# Patient Record
Sex: Female | Born: 1940 | Race: White | Hispanic: No | Marital: Married | Smoking: Current every day smoker
Health system: Southern US, Community
[De-identification: ages and names within clinical notes are randomized; demographics above are authoritative.]

## PROBLEM LIST (undated history)

## (undated) ENCOUNTER — Emergency Department (HOSPITAL_COMMUNITY): Discharge status: Absent without leave | Payer: Medicare Other

## (undated) DIAGNOSIS — K219 Gastro-esophageal reflux disease without esophagitis: Secondary | ICD-10-CM

## (undated) DIAGNOSIS — T7840XA Allergy, unspecified, initial encounter: Secondary | ICD-10-CM

## (undated) DIAGNOSIS — Z79890 Hormone replacement therapy (postmenopausal): Secondary | ICD-10-CM

## (undated) DIAGNOSIS — M1990 Unspecified osteoarthritis, unspecified site: Secondary | ICD-10-CM

## (undated) HISTORY — PX: TUBAL LIGATION: SHX77

## (undated) HISTORY — PX: TONSILLECTOMY: SUR1361

## (undated) HISTORY — DX: Hormone replacement therapy: Z79.890

## (undated) HISTORY — DX: Unspecified osteoarthritis, unspecified site: M19.90

## (undated) HISTORY — DX: Allergy, unspecified, initial encounter: T78.40XA

## (undated) HISTORY — PX: APPENDECTOMY: SHX54

## (undated) HISTORY — DX: Gastro-esophageal reflux disease without esophagitis: K21.9

---

## 2001-12-27 ENCOUNTER — Other Ambulatory Visit: Admission: RE | Admit: 2001-12-27 | Discharge: 2001-12-27 | Payer: Self-pay | Admitting: Internal Medicine

## 2002-04-06 ENCOUNTER — Encounter: Admission: RE | Admit: 2002-04-06 | Discharge: 2002-04-06 | Payer: Self-pay | Admitting: Internal Medicine

## 2002-04-06 ENCOUNTER — Encounter: Payer: Self-pay | Admitting: Internal Medicine

## 2003-03-21 ENCOUNTER — Other Ambulatory Visit: Admission: RE | Admit: 2003-03-21 | Discharge: 2003-03-21 | Payer: Self-pay | Admitting: Neurosurgery

## 2003-11-21 ENCOUNTER — Ambulatory Visit (HOSPITAL_COMMUNITY): Admission: RE | Admit: 2003-11-21 | Discharge: 2003-11-21 | Payer: Self-pay | Admitting: Internal Medicine

## 2004-05-27 ENCOUNTER — Ambulatory Visit: Payer: Self-pay | Admitting: Internal Medicine

## 2004-05-27 ENCOUNTER — Other Ambulatory Visit: Admission: RE | Admit: 2004-05-27 | Discharge: 2004-05-27 | Payer: Self-pay | Admitting: Internal Medicine

## 2004-09-23 ENCOUNTER — Ambulatory Visit: Payer: Self-pay | Admitting: Internal Medicine

## 2004-10-09 ENCOUNTER — Ambulatory Visit: Payer: Self-pay | Admitting: Internal Medicine

## 2004-12-05 ENCOUNTER — Ambulatory Visit: Payer: Self-pay | Admitting: Internal Medicine

## 2005-03-05 ENCOUNTER — Other Ambulatory Visit: Admission: RE | Admit: 2005-03-05 | Discharge: 2005-03-05 | Payer: Self-pay | Admitting: Obstetrics and Gynecology

## 2005-11-19 ENCOUNTER — Ambulatory Visit: Payer: Self-pay | Admitting: Internal Medicine

## 2006-02-02 ENCOUNTER — Ambulatory Visit: Payer: Self-pay | Admitting: Internal Medicine

## 2006-04-02 ENCOUNTER — Ambulatory Visit: Payer: Self-pay | Admitting: Internal Medicine

## 2006-05-25 ENCOUNTER — Ambulatory Visit: Payer: Self-pay | Admitting: Internal Medicine

## 2006-08-23 ENCOUNTER — Ambulatory Visit: Payer: Self-pay | Admitting: Internal Medicine

## 2006-08-23 LAB — CONVERTED CEMR LAB
BUN: 12 mg/dL (ref 6–23)
Basophils Relative: 0.6 % (ref 0.0–1.0)
CO2: 28 meq/L (ref 19–32)
Creatinine, Ser: 0.9 mg/dL (ref 0.4–1.2)
Eosinophils Relative: 1.5 % (ref 0.0–5.0)
Glucose, Bld: 88 mg/dL (ref 70–99)
HCT: 43.2 % (ref 36.0–46.0)
Hemoglobin: 14.8 g/dL (ref 12.0–15.0)
Lymphocytes Relative: 30.3 % (ref 12.0–46.0)
Monocytes Absolute: 0.6 10*3/uL (ref 0.2–0.7)
Neutro Abs: 7 10*3/uL (ref 1.4–7.7)
Neutrophils Relative %: 61.9 % (ref 43.0–77.0)
Potassium: 5.9 meq/L — ABNORMAL HIGH (ref 3.5–5.1)
RDW: 13 % (ref 11.5–14.6)
WBC: 11.4 10*3/uL — ABNORMAL HIGH (ref 4.5–10.5)

## 2006-09-22 ENCOUNTER — Ambulatory Visit: Payer: Self-pay | Admitting: Internal Medicine

## 2006-09-22 LAB — CONVERTED CEMR LAB
Basophils Absolute: 0 10*3/uL (ref 0.0–0.1)
Basophils Relative: 0.2 % (ref 0.0–1.0)
Eosinophils Absolute: 0.1 10*3/uL (ref 0.0–0.6)
Eosinophils Relative: 0.7 % (ref 0.0–5.0)
HCT: 43 % (ref 36.0–46.0)
Hemoglobin: 14.9 g/dL (ref 12.0–15.0)
Lymphocytes Relative: 24.2 % (ref 12.0–46.0)
MCHC: 34.6 g/dL (ref 30.0–36.0)
MCV: 89.5 fL (ref 78.0–100.0)
Monocytes Absolute: 0.9 10*3/uL — ABNORMAL HIGH (ref 0.2–0.7)
Monocytes Relative: 6.5 % (ref 3.0–11.0)
Neutro Abs: 9.8 10*3/uL — ABNORMAL HIGH (ref 1.4–7.7)
Neutrophils Relative %: 68.4 % (ref 43.0–77.0)
Platelets: 396 10*3/uL (ref 150–400)
RBC: 4.81 M/uL (ref 3.87–5.11)
RDW: 13.1 % (ref 11.5–14.6)
WBC: 14.3 10*3/uL — ABNORMAL HIGH (ref 4.5–10.5)

## 2006-11-04 ENCOUNTER — Ambulatory Visit: Payer: Self-pay | Admitting: Family Medicine

## 2006-11-04 ENCOUNTER — Encounter: Payer: Self-pay | Admitting: Internal Medicine

## 2006-12-22 ENCOUNTER — Ambulatory Visit: Payer: Self-pay | Admitting: Internal Medicine

## 2007-01-11 ENCOUNTER — Ambulatory Visit: Payer: Self-pay | Admitting: Internal Medicine

## 2007-01-19 ENCOUNTER — Ambulatory Visit: Payer: Self-pay | Admitting: Internal Medicine

## 2007-01-19 LAB — CONVERTED CEMR LAB
Basophils Relative: 0.2 % (ref 0.0–1.0)
HCT: 39.2 % (ref 36.0–46.0)
Hemoglobin: 13.6 g/dL (ref 12.0–15.0)
Monocytes Absolute: 0.7 10*3/uL (ref 0.2–0.7)
Neutrophils Relative %: 72.8 % (ref 43.0–77.0)
RBC: 4.32 M/uL (ref 3.87–5.11)
RDW: 13.5 % (ref 11.5–14.6)

## 2007-01-24 ENCOUNTER — Encounter: Payer: Self-pay | Admitting: Internal Medicine

## 2007-01-24 LAB — COMPREHENSIVE METABOLIC PANEL
ALT: 17 U/L (ref 0–35)
AST: 18 U/L (ref 0–37)
Albumin: 4.4 g/dL (ref 3.5–5.2)
Alkaline Phosphatase: 98 U/L (ref 39–117)
BUN: 11 mg/dL (ref 6–23)
CO2: 23 mEq/L (ref 19–32)
Calcium: 9.5 mg/dL (ref 8.4–10.5)
Chloride: 104 mEq/L (ref 96–112)
Creatinine, Ser: 0.91 mg/dL (ref 0.40–1.20)
Glucose, Bld: 79 mg/dL (ref 70–99)
Potassium: 4.7 mEq/L (ref 3.5–5.3)
Sodium: 139 mEq/L (ref 135–145)
Total Bilirubin: 0.3 mg/dL (ref 0.3–1.2)
Total Protein: 7.2 g/dL (ref 6.0–8.3)

## 2007-01-24 LAB — CBC WITH DIFFERENTIAL/PLATELET
BASO%: 0.2 % (ref 0.0–2.0)
Basophils Absolute: 0 10*3/uL (ref 0.0–0.1)
EOS%: 0.8 % (ref 0.0–7.0)
HCT: 42 % (ref 34.8–46.6)
MCH: 31.5 pg (ref 26.0–34.0)
MCHC: 35 g/dL (ref 32.0–36.0)
MCV: 89.7 fL (ref 81.0–101.0)
MONO%: 4.7 % (ref 0.0–13.0)
NEUT%: 76.3 % (ref 39.6–76.8)
lymph#: 2.7 10*3/uL (ref 0.9–3.3)

## 2007-01-26 ENCOUNTER — Ambulatory Visit: Payer: Self-pay | Admitting: Internal Medicine

## 2007-01-26 LAB — CONVERTED CEMR LAB
Eosinophils Absolute: 0.1 10*3/uL (ref 0.0–0.6)
Eosinophils Relative: 0.8 % (ref 0.0–5.0)
Hemoglobin: 14.4 g/dL (ref 12.0–15.0)
Lymphocytes Relative: 24.4 % (ref 12.0–46.0)
MCV: 90.9 fL (ref 78.0–100.0)
Monocytes Absolute: 0.8 10*3/uL — ABNORMAL HIGH (ref 0.2–0.7)
Monocytes Relative: 6 % (ref 3.0–11.0)
Neutro Abs: 9.5 10*3/uL — ABNORMAL HIGH (ref 1.4–7.7)
Platelets: 369 10*3/uL (ref 150–400)
WBC: 13.9 10*3/uL — ABNORMAL HIGH (ref 4.5–10.5)

## 2007-02-02 LAB — BCR/ABL

## 2007-02-08 ENCOUNTER — Encounter: Payer: Self-pay | Admitting: Internal Medicine

## 2007-02-08 LAB — CBC WITH DIFFERENTIAL/PLATELET
EOS%: 0.8 % (ref 0.0–7.0)
Eosinophils Absolute: 0.1 10*3/uL (ref 0.0–0.5)
HGB: 14.8 g/dL (ref 11.6–15.9)
MCH: 31.4 pg (ref 26.0–34.0)
MCV: 88.7 fL (ref 81.0–101.0)
MONO%: 6.2 % (ref 0.0–13.0)
NEUT#: 9.3 10*3/uL — ABNORMAL HIGH (ref 1.5–6.5)
RBC: 4.72 10*6/uL (ref 3.70–5.32)
RDW: 13.4 % (ref 11.3–14.5)
lymph#: 3.1 10*3/uL (ref 0.9–3.3)

## 2007-02-08 LAB — LACTATE DEHYDROGENASE: LDH: 149 U/L (ref 94–250)

## 2007-03-15 ENCOUNTER — Telehealth: Payer: Self-pay | Admitting: Internal Medicine

## 2007-03-24 DIAGNOSIS — E785 Hyperlipidemia, unspecified: Secondary | ICD-10-CM

## 2007-03-28 ENCOUNTER — Ambulatory Visit: Payer: Self-pay | Admitting: Internal Medicine

## 2007-03-28 DIAGNOSIS — M1990 Unspecified osteoarthritis, unspecified site: Secondary | ICD-10-CM | POA: Insufficient documentation

## 2007-03-28 DIAGNOSIS — J309 Allergic rhinitis, unspecified: Secondary | ICD-10-CM | POA: Insufficient documentation

## 2007-03-28 DIAGNOSIS — K219 Gastro-esophageal reflux disease without esophagitis: Secondary | ICD-10-CM

## 2007-03-28 DIAGNOSIS — D7289 Other specified disorders of white blood cells: Secondary | ICD-10-CM

## 2007-03-28 LAB — CONVERTED CEMR LAB: Cholesterol, target level: 200 mg/dL

## 2007-04-01 ENCOUNTER — Telehealth: Payer: Self-pay | Admitting: *Deleted

## 2007-04-01 LAB — CONVERTED CEMR LAB
Basophils Absolute: 0.1 10*3/uL (ref 0.0–0.1)
Basophils Relative: 0.5 % (ref 0.0–1.0)
Cholesterol: 196 mg/dL (ref 0–200)
HDL: 39.7 mg/dL (ref >39.0)
Hemoglobin: 14.3 g/dL (ref 12.0–15.0)
LDL Cholesterol: 123 mg/dL — ABNORMAL HIGH (ref 0–99)
MCHC: 34.5 g/dL (ref 30.0–36.0)
Monocytes Absolute: 0.7 10*3/uL (ref 0.2–0.7)
Monocytes Relative: 5.4 % (ref 3.0–11.0)
Platelets: 354 10*3/uL (ref 150–400)
RBC: 4.55 M/uL (ref 3.87–5.11)
RDW: 13 % (ref 11.5–14.6)
Total CHOL/HDL Ratio: 4.9
VLDL: 33 mg/dL (ref 0–40)

## 2007-04-13 ENCOUNTER — Telehealth: Payer: Self-pay | Admitting: Internal Medicine

## 2007-05-02 ENCOUNTER — Ambulatory Visit: Payer: Self-pay | Admitting: Internal Medicine

## 2007-05-04 LAB — COMPREHENSIVE METABOLIC PANEL
ALT: 13 U/L (ref 0–35)
Albumin: 4.5 g/dL (ref 3.5–5.2)
CO2: 24 mEq/L (ref 19–32)
Calcium: 9.8 mg/dL (ref 8.4–10.5)
Chloride: 102 mEq/L (ref 96–112)
Potassium: 4.6 mEq/L (ref 3.5–5.3)
Sodium: 139 mEq/L (ref 135–145)
Total Protein: 7.4 g/dL (ref 6.0–8.3)

## 2007-05-04 LAB — CBC WITH DIFFERENTIAL/PLATELET
BASO%: 0.5 % (ref 0.0–2.0)
Eosinophils Absolute: 0.2 10*3/uL (ref 0.0–0.5)
MCHC: 34.6 g/dL (ref 32.0–36.0)
MONO#: 0.7 10*3/uL (ref 0.1–0.9)
NEUT#: 8.9 10*3/uL — ABNORMAL HIGH (ref 1.5–6.5)
RBC: 4.62 10*6/uL (ref 3.70–5.32)
WBC: 13.7 10*3/uL — ABNORMAL HIGH (ref 3.9–10.0)
lymph#: 3.9 10*3/uL — ABNORMAL HIGH (ref 0.9–3.3)

## 2007-05-04 LAB — LACTATE DEHYDROGENASE: LDH: 170 U/L (ref 94–250)

## 2007-05-11 ENCOUNTER — Encounter: Payer: Self-pay | Admitting: Internal Medicine

## 2007-05-11 ENCOUNTER — Telehealth: Payer: Self-pay | Admitting: Internal Medicine

## 2007-05-27 ENCOUNTER — Ambulatory Visit: Payer: Self-pay | Admitting: Internal Medicine

## 2007-05-27 DIAGNOSIS — J019 Acute sinusitis, unspecified: Secondary | ICD-10-CM

## 2007-07-25 ENCOUNTER — Telehealth: Payer: Self-pay | Admitting: Internal Medicine

## 2007-08-04 ENCOUNTER — Telehealth: Payer: Self-pay | Admitting: Internal Medicine

## 2007-08-30 ENCOUNTER — Ambulatory Visit: Payer: Self-pay | Admitting: Internal Medicine

## 2007-08-30 DIAGNOSIS — K648 Other hemorrhoids: Secondary | ICD-10-CM | POA: Insufficient documentation

## 2007-08-30 DIAGNOSIS — F17200 Nicotine dependence, unspecified, uncomplicated: Secondary | ICD-10-CM

## 2007-09-08 LAB — CONVERTED CEMR LAB
Basophils Relative: 0.4 % (ref 0.0–1.0)
Eosinophils Absolute: 0.2 10*3/uL (ref 0.0–0.6)
Eosinophils Relative: 1.8 % (ref 0.0–5.0)
HCT: 41.1 % (ref 36.0–46.0)
Hemoglobin: 14.1 g/dL (ref 12.0–15.0)
Lymphocytes Relative: 27 % (ref 12.0–46.0)
MCV: 91 fL (ref 78.0–100.0)
Neutro Abs: 8.1 10*3/uL — ABNORMAL HIGH (ref 1.4–7.7)
Neutrophils Relative %: 64.9 % (ref 43.0–77.0)
Platelets: 327 10*3/uL (ref 150–400)
WBC: 12.3 10*3/uL — ABNORMAL HIGH (ref 4.5–10.5)

## 2007-12-06 ENCOUNTER — Telehealth: Payer: Self-pay | Admitting: Internal Medicine

## 2007-12-07 ENCOUNTER — Ambulatory Visit: Payer: Self-pay | Admitting: Internal Medicine

## 2007-12-07 DIAGNOSIS — J0100 Acute maxillary sinusitis, unspecified: Secondary | ICD-10-CM | POA: Insufficient documentation

## 2007-12-07 LAB — CONVERTED CEMR LAB
Basophils Absolute: 0 10*3/uL (ref 0.0–0.1)
Basophils Relative: 0.2 % (ref 0.0–1.0)
Eosinophils Absolute: 0.1 10*3/uL (ref 0.0–0.7)
Lymphocytes Relative: 20 % (ref 12.0–46.0)
MCHC: 33.2 g/dL (ref 30.0–36.0)
MCV: 92.6 fL (ref 78.0–100.0)
Neutro Abs: 8.7 10*3/uL — ABNORMAL HIGH (ref 1.4–7.7)
Neutrophils Relative %: 73.2 % (ref 43.0–77.0)
RBC: 4.63 M/uL (ref 3.87–5.11)
RDW: 13.2 % (ref 11.5–14.6)

## 2007-12-20 ENCOUNTER — Ambulatory Visit: Payer: Self-pay | Admitting: Internal Medicine

## 2008-01-06 ENCOUNTER — Ambulatory Visit: Payer: Self-pay | Admitting: Internal Medicine

## 2008-01-06 ENCOUNTER — Telehealth: Payer: Self-pay | Admitting: Internal Medicine

## 2008-01-06 DIAGNOSIS — N390 Urinary tract infection, site not specified: Secondary | ICD-10-CM

## 2008-01-06 DIAGNOSIS — N760 Acute vaginitis: Secondary | ICD-10-CM | POA: Insufficient documentation

## 2008-01-06 DIAGNOSIS — M5380 Other specified dorsopathies, site unspecified: Secondary | ICD-10-CM | POA: Insufficient documentation

## 2008-01-06 LAB — CONVERTED CEMR LAB
Glucose, Urine, Semiquant: NEGATIVE
Ketones, urine, test strip: NEGATIVE
Nitrite: NEGATIVE
Specific Gravity, Urine: <1.005
WBC Urine, dipstick: NEGATIVE
pH: 7

## 2008-03-14 ENCOUNTER — Telehealth: Payer: Self-pay | Admitting: Internal Medicine

## 2008-03-16 ENCOUNTER — Telehealth: Payer: Self-pay | Admitting: Internal Medicine

## 2008-04-16 ENCOUNTER — Ambulatory Visit: Payer: Self-pay | Admitting: Internal Medicine

## 2008-04-16 LAB — CONVERTED CEMR LAB
ALT: 16 units/L (ref 0–35)
AST: 24 units/L (ref 0–37)
Albumin: 3.9 g/dL (ref 3.5–5.2)
Alkaline Phosphatase: 78 units/L (ref 39–117)
BUN: 15 mg/dL (ref 6–23)
Basophils Absolute: 0.1 10*3/uL (ref 0.0–0.1)
Basophils Relative: 0.7 % (ref 0.0–3.0)
Bilirubin, Direct: 0.1 mg/dL (ref 0.0–0.3)
CO2: 30 meq/L (ref 19–32)
Calcium: 9.7 mg/dL (ref 8.4–10.5)
Chloride: 107 meq/L (ref 96–112)
Cholesterol: 230 mg/dL (ref 0–200)
Creatinine, Ser: 0.9 mg/dL (ref 0.4–1.2)
Direct LDL: 141.3 mg/dL
Eosinophils Absolute: 0.2 10*3/uL (ref 0.0–0.7)
Eosinophils Relative: 1.7 % (ref 0.0–5.0)
GFR calc Af Amer: 80 mL/min
GFR calc non Af Amer: 66 mL/min
Glucose, Bld: 90 mg/dL (ref 70–99)
HCT: 42 % (ref 36.0–46.0)
HDL: 44.9 mg/dL (ref >39.0)
Hemoglobin: 14.3 g/dL (ref 12.0–15.0)
Lymphocytes Relative: 26.5 % (ref 12.0–46.0)
MCHC: 34.1 g/dL (ref 30.0–36.0)
MCV: 94.4 fL (ref 78.0–100.0)
Monocytes Absolute: 0.7 10*3/uL (ref 0.1–1.0)
Monocytes Relative: 7.5 % (ref 3.0–12.0)
Neutro Abs: 6 10*3/uL (ref 1.4–7.7)
Neutrophils Relative %: 63.6 % (ref 43.0–77.0)
Platelets: 344 10*3/uL (ref 150–400)
Potassium: 4.4 meq/L (ref 3.5–5.1)
RBC: 4.45 M/uL (ref 3.87–5.11)
RDW: 13.3 % (ref 11.5–14.6)
Sodium: 146 meq/L — ABNORMAL HIGH (ref 135–145)
TSH: 3.81 microintl units/mL (ref 0.35–5.50)
Total Bilirubin: 0.4 mg/dL (ref 0.3–1.2)
Total CHOL/HDL Ratio: 5.1
Total Protein: 7.1 g/dL (ref 6.0–8.3)
Triglycerides: 157 mg/dL — ABNORMAL HIGH (ref 0–149)
VLDL: 31 mg/dL (ref 0–40)
WBC: 9.5 10*3/uL (ref 4.5–10.5)

## 2008-04-23 ENCOUNTER — Ambulatory Visit: Payer: Self-pay | Admitting: Internal Medicine

## 2008-06-13 ENCOUNTER — Telehealth: Payer: Self-pay | Admitting: Internal Medicine

## 2008-07-18 ENCOUNTER — Ambulatory Visit: Payer: Self-pay | Admitting: Internal Medicine

## 2008-07-18 LAB — CONVERTED CEMR LAB
AST: 21 units/L (ref 0–37)
Albumin: 4 g/dL (ref 3.5–5.2)
Alkaline Phosphatase: 75 units/L (ref 39–117)
Total CHOL/HDL Ratio: 4.2
Triglycerides: 83 mg/dL (ref 0–149)

## 2008-07-26 ENCOUNTER — Ambulatory Visit: Payer: Self-pay | Admitting: Internal Medicine

## 2008-09-09 LAB — CONVERTED CEMR LAB: Pap Smear: NORMAL

## 2008-09-26 ENCOUNTER — Ambulatory Visit: Payer: Self-pay | Admitting: Internal Medicine

## 2008-09-26 LAB — CONVERTED CEMR LAB
ALT: 17 units/L (ref 0–35)
AST: 25 units/L (ref 0–37)
Albumin: 4.4 g/dL (ref 3.5–5.2)
Basophils Absolute: 0 10*3/uL (ref 0.0–0.1)
Hemoglobin: 15.1 g/dL — ABNORMAL HIGH (ref 12.0–15.0)
Lymphocytes Relative: 21.2 % (ref 12.0–46.0)
Monocytes Relative: 4.8 % (ref 3.0–12.0)
Platelets: 320 10*3/uL (ref 150.0–400.0)
RDW: 13.3 % (ref 11.5–14.6)
TSH: 1.84 microintl units/mL (ref 0.35–5.50)

## 2008-10-04 ENCOUNTER — Ambulatory Visit: Payer: Self-pay | Admitting: Internal Medicine

## 2009-01-03 ENCOUNTER — Ambulatory Visit: Payer: Self-pay | Admitting: Internal Medicine

## 2009-01-03 LAB — CONVERTED CEMR LAB
Basophils Absolute: 0 10*3/uL
Basophils Relative: 0.2 %
Cholesterol: 210 mg/dL — ABNORMAL HIGH
Direct LDL: 138.7 mg/dL
Eosinophils Absolute: 0.1 10*3/uL
Eosinophils Relative: 1.1 %
HCT: 42.8 %
HDL: 51.8 mg/dL
Hemoglobin: 14.9 g/dL
Lymphocytes Relative: 17.9 %
Lymphs Abs: 1.8 10*3/uL
MCHC: 34.7 g/dL
MCV: 92.3 fL
Monocytes Absolute: 0.6 10*3/uL
Monocytes Relative: 6.2 %
Neutro Abs: 7.8 10*3/uL — ABNORMAL HIGH
Neutrophils Relative %: 74.6 %
Platelets: 349 10*3/uL
RBC: 4.63 M/uL
RDW: 12.9 %
TSH: 1.77 u[IU]/mL
Total CHOL/HDL Ratio: 4
Triglycerides: 116 mg/dL
VLDL: 23.2 mg/dL
WBC: 10.3 10*3/uL

## 2009-02-18 ENCOUNTER — Ambulatory Visit: Payer: Self-pay | Admitting: Internal Medicine

## 2009-02-18 ENCOUNTER — Telehealth: Payer: Self-pay | Admitting: Internal Medicine

## 2009-02-22 ENCOUNTER — Telehealth (INDEPENDENT_AMBULATORY_CARE_PROVIDER_SITE_OTHER): Payer: Self-pay | Admitting: *Deleted

## 2009-02-26 ENCOUNTER — Ambulatory Visit: Payer: Self-pay | Admitting: Internal Medicine

## 2009-04-15 ENCOUNTER — Telehealth: Payer: Self-pay | Admitting: Internal Medicine

## 2009-04-25 ENCOUNTER — Ambulatory Visit: Payer: Self-pay | Admitting: Internal Medicine

## 2009-06-03 ENCOUNTER — Ambulatory Visit: Payer: Self-pay | Admitting: Internal Medicine

## 2009-06-03 LAB — CONVERTED CEMR LAB
ALT: 17 units/L (ref 0–35)
AST: 23 units/L (ref 0–37)
Albumin: 4 g/dL (ref 3.5–5.2)
Alkaline Phosphatase: 73 units/L (ref 39–117)
Basophils Relative: 0.6 % (ref 0.0–3.0)
Eosinophils Relative: 3.1 % (ref 0.0–5.0)
GFR calc non Af Amer: 65.74 mL/min (ref >60)
Glucose, Bld: 98 mg/dL (ref 70–99)
Glucose, Urine, Semiquant: NEGATIVE
HCT: 41 % (ref 36.0–46.0)
Hemoglobin: 13.9 g/dL (ref 12.0–15.0)
Lymphs Abs: 2.2 10*3/uL (ref 0.7–4.0)
Monocytes Relative: 5.2 % (ref 3.0–12.0)
Neutro Abs: 6.3 10*3/uL (ref 1.4–7.7)
Potassium: 4.6 meq/L (ref 3.5–5.1)
Protein, U semiquant: NEGATIVE
RBC: 4.32 M/uL (ref 3.87–5.11)
RDW: 13.8 % (ref 11.5–14.6)
Sodium: 140 meq/L (ref 135–145)
Specific Gravity, Urine: 1.015
TSH: 2.88 microintl units/mL (ref 0.35–5.50)
Total CHOL/HDL Ratio: 4
Total Protein: 6.8 g/dL (ref 6.0–8.3)
VLDL: 20.4 mg/dL (ref 0.0–40.0)
WBC: 9.4 10*3/uL (ref 4.5–10.5)
pH: 7

## 2009-06-10 ENCOUNTER — Ambulatory Visit: Payer: Self-pay | Admitting: Internal Medicine

## 2009-07-29 ENCOUNTER — Telehealth: Payer: Self-pay | Admitting: Internal Medicine

## 2009-09-23 ENCOUNTER — Ambulatory Visit: Payer: Self-pay | Admitting: Internal Medicine

## 2009-09-24 ENCOUNTER — Telehealth: Payer: Self-pay | Admitting: Internal Medicine

## 2009-10-01 ENCOUNTER — Telehealth: Payer: Self-pay | Admitting: Internal Medicine

## 2009-10-15 ENCOUNTER — Telehealth: Payer: Self-pay | Admitting: Internal Medicine

## 2009-12-18 ENCOUNTER — Telehealth: Payer: Self-pay | Admitting: Internal Medicine

## 2009-12-19 ENCOUNTER — Ambulatory Visit: Payer: Self-pay | Admitting: Internal Medicine

## 2009-12-19 DIAGNOSIS — R319 Hematuria, unspecified: Secondary | ICD-10-CM | POA: Insufficient documentation

## 2009-12-19 LAB — CONVERTED CEMR LAB
Bilirubin Urine: NEGATIVE
Glucose, Urine, Semiquant: NEGATIVE
Ketones, urine, test strip: NEGATIVE
Specific Gravity, Urine: 1.01
WBC Urine, dipstick: NEGATIVE
pH: 7

## 2009-12-20 ENCOUNTER — Encounter: Payer: Self-pay | Admitting: Internal Medicine

## 2009-12-20 ENCOUNTER — Ambulatory Visit (HOSPITAL_COMMUNITY): Admission: RE | Admit: 2009-12-20 | Discharge: 2009-12-20 | Payer: Self-pay | Admitting: Internal Medicine

## 2009-12-24 ENCOUNTER — Ambulatory Visit: Payer: Self-pay | Admitting: Internal Medicine

## 2009-12-24 DIAGNOSIS — N281 Cyst of kidney, acquired: Secondary | ICD-10-CM

## 2010-01-02 ENCOUNTER — Telehealth: Payer: Self-pay | Admitting: Internal Medicine

## 2010-01-03 ENCOUNTER — Ambulatory Visit: Payer: Self-pay | Admitting: Family Medicine

## 2010-01-03 ENCOUNTER — Encounter: Payer: Self-pay | Admitting: Internal Medicine

## 2010-01-03 LAB — CONVERTED CEMR LAB
Basophils Absolute: 0 10*3/uL (ref 0.0–0.1)
Basophils Relative: 0 % (ref 0–1)
Bilirubin Urine: NEGATIVE
Glucose, Urine, Semiquant: NEGATIVE
MCHC: 33.6 g/dL (ref 30.0–36.0)
Monocytes Relative: 8 % (ref 3–12)
Neutro Abs: 7.6 10*3/uL (ref 1.7–7.7)
Neutrophils Relative %: 68 % (ref 43–77)
Nitrite: NEGATIVE
RBC: 4.76 M/uL (ref 3.87–5.11)
Sed Rate: 6 mm/hr (ref 0–22)
Specific Gravity, Urine: 1

## 2010-02-21 ENCOUNTER — Telehealth (INDEPENDENT_AMBULATORY_CARE_PROVIDER_SITE_OTHER): Payer: Self-pay | Admitting: *Deleted

## 2010-03-20 ENCOUNTER — Encounter: Payer: Self-pay | Admitting: Internal Medicine

## 2010-03-24 ENCOUNTER — Ambulatory Visit (HOSPITAL_COMMUNITY): Admission: RE | Admit: 2010-03-24 | Discharge: 2010-03-24 | Payer: Self-pay | Admitting: Internal Medicine

## 2010-04-14 ENCOUNTER — Ambulatory Visit: Payer: Self-pay | Admitting: Internal Medicine

## 2010-04-14 ENCOUNTER — Telehealth: Payer: Self-pay | Admitting: Internal Medicine

## 2010-04-14 DIAGNOSIS — L723 Sebaceous cyst: Secondary | ICD-10-CM

## 2010-04-15 ENCOUNTER — Encounter: Payer: Self-pay | Admitting: Internal Medicine

## 2010-04-21 ENCOUNTER — Telehealth: Payer: Self-pay | Admitting: Internal Medicine

## 2010-04-25 ENCOUNTER — Telehealth: Payer: Self-pay | Admitting: Internal Medicine

## 2010-06-03 ENCOUNTER — Ambulatory Visit: Payer: Self-pay | Admitting: Internal Medicine

## 2010-06-03 DIAGNOSIS — L218 Other seborrheic dermatitis: Secondary | ICD-10-CM

## 2010-07-15 ENCOUNTER — Ambulatory Visit
Admission: RE | Admit: 2010-07-15 | Discharge: 2010-07-15 | Payer: Self-pay | Source: Home / Self Care | Attending: Internal Medicine | Admitting: Internal Medicine

## 2010-07-15 LAB — CONVERTED CEMR LAB
Albumin: 3.9 g/dL (ref 3.5–5.2)
Alkaline Phosphatase: 74 units/L (ref 39–117)
Basophils Relative: 0.2 % (ref 0.0–3.0)
CO2: 28 meq/L (ref 19–32)
Chloride: 104 meq/L (ref 96–112)
Eosinophils Absolute: 0.2 10*3/uL (ref 0.0–0.7)
HCT: 43.5 % (ref 36.0–46.0)
Hemoglobin: 14.7 g/dL (ref 12.0–15.0)
Ketones, urine, test strip: NEGATIVE
Lymphocytes Relative: 16.4 % (ref 12.0–46.0)
MCHC: 33.8 g/dL (ref 30.0–36.0)
MCV: 94.8 fL (ref 78.0–100.0)
Monocytes Absolute: 0.7 10*3/uL (ref 0.1–1.0)
Neutro Abs: 10.1 10*3/uL — ABNORMAL HIGH (ref 1.4–7.7)
Nitrite: NEGATIVE
Potassium: 4.7 meq/L (ref 3.5–5.1)
RBC: 4.59 M/uL (ref 3.87–5.11)
Sodium: 139 meq/L (ref 135–145)
Total CHOL/HDL Ratio: 4
Total Protein: 6.9 g/dL (ref 6.0–8.3)
pH: 7

## 2010-07-21 ENCOUNTER — Ambulatory Visit
Admission: RE | Admit: 2010-07-21 | Discharge: 2010-07-21 | Payer: Self-pay | Source: Home / Self Care | Attending: Internal Medicine | Admitting: Internal Medicine

## 2010-07-21 DIAGNOSIS — N6009 Solitary cyst of unspecified breast: Secondary | ICD-10-CM | POA: Insufficient documentation

## 2010-07-22 ENCOUNTER — Encounter
Admission: RE | Admit: 2010-07-22 | Discharge: 2010-07-22 | Payer: Self-pay | Source: Home / Self Care | Attending: Obstetrics and Gynecology | Admitting: Obstetrics and Gynecology

## 2010-08-12 NOTE — Progress Notes (Signed)
Summary: Pt had questions re: xray and req refill of Meloxicam  Phone Note Call from Patient Call back at Home Phone [phone]   Caller: Patient Summary of Call: Pt called and has questions re: xray results. Pt also req refill of Meloxicam 15mg . Pls call in to [facility].  Initial call taken by: Lucy Antigua,  April 25, 2010 9:41 AM    Prescriptions: MELOXICAM 15 MG TABS (MELOXICAM) 1 once daily as needed  #30 x 2   Entered by:   Willy Eddy, LPN   Authorized by:   Stacie Glaze MD   Signed by:   Willy Eddy, LPN on 09/81/1914   Method used:   Electronically to        [facility]       [address]       [place], [place]  [address]       Ph: [phone] or [phone]       Fax: [phone]   RxID:   [id number]

## 2010-08-12 NOTE — Progress Notes (Signed)
Summary: Switch to Malaxacam  Phone Note Call from Patient Call back at Home Phone [phone]   Summary of Call: Currently taking Durabac, she feels this med does not work and wants to go back on Malaxacam.  [facility] send [facility]ill to [facility]  Initial call taken by: Trixie Dredge,  April 21, 2010 9:34 AM    New/Updated Medications: MELOXICAM 15 MG TABS (MELOXICAM) 1 once daily as needed Prescriptions: MELOXICAM 15 MG TABS (MELOXICAM) 1 once daily as needed  #30 x 1   Entered by:   Willy Eddy, LPN   Authorized by:   Stacie Glaze MD   Signed by:   Willy Eddy, LPN on 13/02/6577   Method used:   Electronically to        [facility]       [address]       [place], [place]  [address]       Ph: [phone] or [phone]       Fax: [phone]   RxID:   [id number]

## 2010-08-12 NOTE — Progress Notes (Signed)
  Phone Note Call from Patient   Caller: Patient Call For: Stacie Glaze MD Summary of Call: [phone] Pt feels like "she is hungry" all the time.  Also complains mid back pain.  Pain is better after eating.  Wants to increase Ompeprazole to two times a day.   [facility] Initial call taken by: Lynann Beaver CMA,  February 21, 2010 12:50 PM  Follow-up for Phone Call        per dr Lovell Sheehan- m ay take omeprazole two times a day  Follow-up by: Willy Eddy, LPN,  February 21, 2010 2:09 PM  Additional Follow-up for Phone Call Additional follow up Details #1::        Notified pt. Additional Follow-up by: Lynann Beaver CMA,  February 21, 2010 2:19 PM

## 2010-08-12 NOTE — Progress Notes (Signed)
Summary: sore in nose  Phone Note Call from Patient Call back at Home Phone [phone]   Reason for Call: Insurance Question Summary of Call: Has a sore in her nose right side.  Has been putting Neosporin on it & it's not working.  Has had impetigo before nose same are on right side.  Gave sample of something before in a little packet & it went away.  It was there ot ov, but forgot to mention.  [facility]  [facility]. Initial call taken by: Rudy Jew, RN,  October 15, 2009 11:37 AM  Follow-up for Phone Call        per dr Lovell Sheehan- may have generic bactroban ointment- apply three times a day-already sent to pharmacy Follow-up by: Willy Eddy, LPN,  October 15, 2009 11:42 AM  Additional Follow-up for Phone Call Additional follow up Details #1::        Phone Call Completed Additional Follow-up by: Rudy Jew, RN,  October 15, 2009 11:44 AM    New/Updated Medications: BACTROBAN 2 % OINT (MUPIROCIN) apply to sore tid Prescriptions: BACTROBAN 2 % OINT (MUPIROCIN) apply to sore tid  #1 small tube x 0   Entered by:   Willy Eddy, LPN   Authorized by:   Stacie Glaze MD   Signed by:   Willy Eddy, LPN on 40/04/2724   Method used:   Electronically to        [facility]       [address]       [place], [place]  [address]       Ph: [phone] or [phone]       Fax: [phone]   RxID:   [id number]

## 2010-08-12 NOTE — Progress Notes (Signed)
Summary: ear pain/ pain behind eyes  Phone Note From Pharmacy Call back at Home Phone [phone]   Caller: Patient Call For: Stacie Glaze MD Summary of Call: pain behind eyes for 1 month also ear pain,  pt would like something [facility] [phone] Initial call taken by: Heron Sabins,  July 29, 2009 9:23 AM Reason for Call: Allergy Alert, Medication not on formulary  Follow-up for Phone Call        per edr jenins-septra ds two times a day for 10 days and take zyrtec d- med sent in and pt informed Follow-up by: Willy Eddy, LPN,  July 29, 2009 9:35 AM    New/Updated Medications: SEPTRA DS 800-160 MG TABS (SULFAMETHOXAZOLE-TRIMETHOPRIM) 1 two times a day Prescriptions: SEPTRA DS 800-160 MG TABS (SULFAMETHOXAZOLE-TRIMETHOPRIM) 1 two times a day  #20 x 0   Entered by:   Willy Eddy, LPN   Authorized by:   Stacie Glaze MD   Signed by:   Willy Eddy, LPN on 58/85/0277   Method used:   Electronically to        [facility]       [address]       [place], [place]  [address]       Ph: [phone] or [phone]       Fax: [phone]   RxID:   [id number]

## 2010-08-12 NOTE — Assessment & Plan Note (Signed)
Summary: uti/bmw   Vital Signs:  Patient profile:   70 year old female Height:      61 inches Weight:      152 pounds BMI:     28.82 Temp:     99.1 degrees F oral Pulse rate:   76 / minute Resp:     14 per minute BP sitting:   130 / 80  (left arm)  Vitals Entered By: Willy Eddy, LPN (December 19, 1608 9:00 AM) CC: c/o low back pain- no dysuria   CC:  c/o low back pain- no dysuria.  History of Present Illness: slight fever with back and flank pain with blood in urine the pain is constant and low grade lke a tooth ache and has some nausea with this no pleuritic pain or cough heating pad did not help no hx of renal dz  Preventive Screening-Counseling & Management  Alcohol-Tobacco     Smoking Status: current     Smoking Cessation Counseling: yes     Packs/Day: 1  Problems Prior to Update: 1)  Chest Discomfort, Atypical  (ICD-786.59) 2)  Bronchitis, Acute  (ICD-466.0) 3)  Physical Examination  (ICD-V70.0) 4)  Muscle Spasm, Back  (ICD-724.8) 5)  Vaginitis, Bacterial  (ICD-616.10) 6)  Uti  (ICD-599.0) 7)  Acute Maxillary Sinusitis  (ICD-461.0) 8)  Internal Hemorrhoids Without Mention Comp  (ICD-455.0) 9)  Tobacco Use  (ICD-305.1) 10)  Sinusitis, Acute  (ICD-461.9) 11)  Osteoarthritis  (ICD-715.90) 12)  Gerd  (ICD-530.81) 13)  Allergic Rhinitis  (ICD-477.9) 14)  Leukocytosis, Chronic  (ICD-288.8) 15)  Hyperlipidemia  (ICD-272.4)  Medications Prior to Update: 1)  Ativan 1 Mg  Tabs (Lorazepam) .... One By Mouth Tid 2)  Prempro 0.3-1.5 Mg  Tabs (Conj Estrog-Medroxyprogest Ace) .... Once Daily 3)  Multivital   Tabs (Multiple Vitamins-Minerals) .... Once Daily 4)  Unisom 25 Mg  Tabs (Doxylamine Succinate (Sleep)) .... At Bedtime 5)  Proctosol Hc 2.5 %  Crea (Hydrocortisone) .... Apply To Rectum As Needed  Hemorrhoids 6)  Zyrtec Allergy 10 Mg Tabs (Cetirizine Hcl) .Marland Kitchen.. 1 Once Daily As Needed 7)  Mucinex Dm Maximum Strength 60-1200 Mg Xr12h-Tab  (Dextromethorphan-Guaifenesin) .Marland Kitchen.. 1 Once Daily As Needed 8)  Omeprazole 40 Mg Cpdr (Omeprazole) .... One By Mouth Daily 9)  Fluticasone Propionate 50 Mcg/act Susp (Fluticasone Propionate) .... Two Spray in Nostril Daily 10)  Etodolac 300 Mg Caps (Etodolac) .... One By Mouth Two Times A Day  Replaces The Meloxicam For The Arthritis 11)  Carisoprodol 350 Mg Tabs (Carisoprodol) .... One By Mouth Nq Hs 12)  Voltaren 1 % Gel (Diclofenac Sodium) .... Aplly To Joints  Two Times A Day Prn 13)  Amoxicillin 500 Mg Caps (Amoxicillin) .Marland Kitchen.. 1 Three Times A Day' For 10 Days 14)  Bactroban 2 % Oint (Mupirocin) .... Apply To Sore Tid  Current Medications (verified): 1)  Ativan 1 Mg  Tabs (Lorazepam) .... One By Mouth Tid 2)  Prempro 0.3-1.5 Mg  Tabs (Conj Estrog-Medroxyprogest Ace) .... Once Daily 3)  Multivital   Tabs (Multiple Vitamins-Minerals) .... Once Daily 4)  Unisom 25 Mg  Tabs (Doxylamine Succinate (Sleep)) .... At Bedtime 5)  Proctosol Hc 2.5 %  Crea (Hydrocortisone) .... Apply To Rectum As Needed  Hemorrhoids 6)  Zyrtec Allergy 10 Mg Tabs (Cetirizine Hcl) .Marland Kitchen.. 1 Once Daily As Needed 7)  Mucinex Dm Maximum Strength 60-1200 Mg Xr12h-Tab (Dextromethorphan-Guaifenesin) .Marland Kitchen.. 1 Once Daily As Needed 8)  Omeprazole 40 Mg Cpdr (Omeprazole) .... One By Mouth  Daily 9)  Fluticasone Propionate 50 Mcg/act Susp (Fluticasone Propionate) .... Two Spray in Nostril Daily 10)  Bactroban 2 % Oint (Mupirocin) .... Apply To Sore Tid 11)  Macrobid 100 Mg Caps (Nitrofurantoin Monohyd Macro) .... One By Mouth Two Times A Day For 7 Days  ( Generic Ok)  Allergies (verified): 1)  * Fluoroquinilone Class  Past History:  Family History: Last updated: 03/24/2007 Family History of HIV Family History Kidney disease Family History of Stroke M 1st degree relative <50  Social History: Last updated: 03/24/2007 Married Retired Current Smoker Alcohol use-no Drug use-no  Risk Factors: Smoking Status: current  (12/19/2009) Packs/Day: 1 (12/19/2009)  Past medical, surgical, family and social histories (including risk factors) reviewed, and no changes noted (except as noted below).  Past Medical History: Reviewed history from 08/30/2007 and no changes required. HRT Allergies Hyperlipidemia Allergic rhinitis GERD Osteoarthritis hemorrhoids  Past Surgical History: Reviewed history from 03/24/2007 and no changes required. Caesarean section x3 Tubal ligation Tonsillectomy  Family History: Reviewed history from 03/24/2007 and no changes required. Family History of HIV Family History Kidney disease Family History of Stroke M 1st degree relative <50  Social History: Reviewed history from 03/24/2007 and no changes required. Married Retired Current Smoker Alcohol use-no Drug use-no  Review of Systems  The patient denies anorexia, fever, weight loss, weight gain, vision loss, decreased hearing, hoarseness, chest pain, syncope, dyspnea on exertion, peripheral edema, prolonged cough, headaches, hemoptysis, abdominal pain, melena, hematochezia, severe indigestion/heartburn, hematuria, incontinence, genital sores, muscle weakness, suspicious skin lesions, transient blindness, difficulty walking, depression, unusual weight change, abnormal bleeding, enlarged lymph nodes, angioedema, and breast masses.         flank pain  Physical Exam  General:  alert and pale.   Head:  normocephalic and atraumatic.   Ears:  R ear normal and L ear normal.   Nose:  no external deformity and no nasal discharge.   Neck:  supple.  no masses.   Lungs:  normal respiratory effort.  increased brnchial breath sounds Heart:  normal rate and regular rhythm.   no rub heard Abdomen:  soft and non-tender.  no masses, no guarding, no rigidity, and no rebound tenderness.     Impression & Recommendations:  Problem # 1:  HEMATURIA UNSPECIFIED (ICD-599.70) Assessment New  The following medications were removed from  the medication list:    Amoxicillin 500 Mg Caps (Amoxicillin) .Marland Kitchen... 1 three times a day' for 10 days Her updated medication list for this problem includes:    Macrobid 100 Mg Caps (Nitrofurantoin monohyd macro) ..... One by mouth two times a day for 7 days  ( generic ok)  Orders: Radiology Referral (Radiology)  Problem # 2:  MUSCLE SPASM, BACK (ICD-724.8) Assessment: Unchanged coulf be spasm or related to  renal dx, [place] ordered The following medications were removed from the medication list:    Etodolac 300 Mg Caps (Etodolac) ..... One by mouth two times a day  replaces the meloxicam for the arthritis    Carisoprodol 350 Mg Tabs (Carisoprodol) ..... One by mouth nq hs  Orders: T-Culture, Urine ([id number])  Problem # 3:  UTI (ICD-599.0) Assessment: Deteriorated reciurrent UTI  culture and [place] ordered The following medications were removed from the medication list:    Amoxicillin 500 Mg Caps (Amoxicillin) .Marland Kitchen... 1 three times a day' for 10 days Her updated medication list for this problem includes:    Macrobid 100 Mg Caps (Nitrofurantoin monohyd macro) ..... One by mouth two times a day for  7 days  ( generic ok)  Orders: UA Dipstick w/o Micro (automated)  (81003) T-Culture, Urine ([id number]) Radiology Referral (Radiology)  Complete Medication List: 1)  Ativan 1 Mg Tabs (Lorazepam) .... One by mouth tid 2)  Prempro 0.3-1.5 Mg Tabs (Conj estrog-medroxyprogest ace) .... Once daily 3)  Multivital Tabs (Multiple vitamins-minerals) .... Once daily 4)  Unisom 25 Mg Tabs (Doxylamine succinate (sleep)) .... At bedtime 5)  Proctosol Hc 2.5 % Crea (Hydrocortisone) .... Apply to rectum as needed  hemorrhoids 6)  Zyrtec Allergy 10 Mg Tabs (Cetirizine hcl) .Marland Kitchen.. 1 once daily as needed 7)  Mucinex Dm Maximum Strength 60-1200 Mg Xr12h-tab (Dextromethorphan-guaifenesin) .Marland Kitchen.. 1 once daily as needed 8)  Omeprazole 40 Mg Cpdr (Omeprazole) .... One by mouth daily 9)  Fluticasone Propionate 50  Mcg/act Susp (Fluticasone propionate) .... Two spray in nostril daily 10)  Bactroban 2 % Oint (Mupirocin) .... Apply to sore tid 11)  Macrobid 100 Mg Caps (Nitrofurantoin monohyd macro) .... One by mouth two times a day for 7 days  ( generic ok) Prescriptions: MACROBID 100 MG CAPS (NITROFURANTOIN MONOHYD MACRO) one by mouth two times a day for 7 days  ( generic ok)  #14 x 0   Entered and Authorized by:   Stacie Glaze MD   Signed by:   Stacie Glaze MD on 12/19/2009   Method used:   Electronically to        [facility]       [address]       [place], [place]  [address]       Ph: [phone] or [phone]       Fax: [phone]   RxID:   [id number]   Laboratory Results   Urine Tests  Date/Time Recieved: December 19, 2009 8:59 AM  Date/Time Reported: December 19, 2009 8:59 AM   Routine Urinalysis   Color: yellow Appearance: Clear Glucose: negative   (Normal Range: Negative) Bilirubin: negative   (Normal Range: Negative) Ketone: negative   (Normal Range: Negative) Spec. Gravity: 1.010   (Normal Range: 1.003-1.035) Blood: trace-lysed   (Normal Range: Negative) pH: 7.0   (Normal Range: 5.0-8.0) Protein: negative   (Normal Range: Negative) Urobilinogen: 0.2   (Normal Range: 0-1) Nitrite: negative   (Normal Range: Negative) Leukocyte Esterace: negative   (Normal Range: Negative)    Comments: Wynona Canes, CMA  December 19, 2009 8:59 AM

## 2010-08-12 NOTE — Progress Notes (Signed)
Summary: headache and pain behind eyes  Phone Note Call from Patient Call back at Work Phone    Caller: Patient Call For: Stacie Glaze MD Reason for Call: Acute Illness Summary of Call: Pt has pain behind eyes and headache, and feels like she has a sinus infection??  Taking Mucinex without any relief.  Minimal congestion. [phone] Initial call taken by: Lynann Beaver CMA,  October 01, 2009 10:59 AM  Follow-up for Phone Call        per dr Lovell Sheehan- amozicillin 500 three times a day for 10 days and can get otc zyrtec d 1 two times a day  Follow-up by: Willy Eddy, LPN,  October 01, 2009 11:44 AM    New/Updated Medications: AMOXICILLIN 500 MG CAPS (AMOXICILLIN) 1 three times a day' for 10 days Prescriptions: AMOXICILLIN 500 MG CAPS (AMOXICILLIN) 1 three times a day' for 10 days  #30 x 0   Entered by:   Willy Eddy, LPN   Authorized by:   Stacie Glaze MD   Signed by:   Willy Eddy, LPN on 45/40/9811   Method used:   Electronically to        [facility]       [address]       [place], [place]  [address]       Ph: [phone] or [phone]       Fax: [phone]   RxID:   [id number]

## 2010-08-12 NOTE — Progress Notes (Signed)
Summary: REQ FOR RX  Phone Note Call from Patient Message from:  Patient on April 14, 2010 2:41 PM  Caller: Patient Summary of Call: Pt called to req that a Rx for yeast infection be [facility]... Pt adv she spoke with Dr Lovell Sheehan about this during her OV today.  Initial call taken by: Debbra Riding,  April 14, 2010 2:42 PM    New/Updated Medications: FLUCONAZOLE 150 MG TABS (FLUCONAZOLE) one by mouth now Prescriptions: FLUCONAZOLE 150 MG TABS (FLUCONAZOLE) one by mouth now  #1 x 0   Entered by:   Lynann Beaver CMA   Authorized by:   Stacie Glaze MD   Signed by:   Lynann Beaver CMA on 04/14/2010   Method used:   Electronically to        [facility]       [address]       [place], [place]  [address]       Ph: [phone] or [phone]       Fax: [phone]   RxID:   [id number]

## 2010-08-12 NOTE — Progress Notes (Signed)
Summary: PT REQ ABX? UTI?  Phone Note Call from Patient   Caller: Patient  [phone] Reason for Call: Talk to Nurse, Talk to Doctor Summary of Call: Pt called to adv that she has been exp back pain (lumbar region) mid-back - denies any other sxs.... Pt believes that she may have a UTI.... Pt requests that an [facility]x [facility] in to [facility].... Pt  didn't want to come in for OV because she said that she already has an appt scheduled with Dr Lovell Sheehan for Tuesday, December 24, 2009..... Can you advise?  Initial call taken by: Debbra Riding,  December 18, 2009 3:00 PM  Follow-up for Phone Call        will come in am for ov/bmw Follow-up by: Willy Eddy, LPN,  December 19, 2954 3:05 PM

## 2010-08-12 NOTE — Assessment & Plan Note (Signed)
Summary: 3 MONTH ROV/NJR   Vital Signs:  Patient profile:   71 year old female Height:      61 inches Weight:      154 pounds BMI:     29.20 Temp:     98.2 degrees F oral Pulse rate:   72 / minute Resp:     14 per minute BP sitting:   120 / 80  (left arm)  Vitals Entered By: Willy Eddy, LPN (December 24, 2009 10:23 AM) CC: roa-f/u ultrasound   CC:  roa-f/u ultrasound.  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) 2)  Hematuria Unspecified  (ICD-599.70) 3)  Chest Discomfort, Atypical  (ICD-786.59) 4)  Bronchitis, Acute  (ICD-466.0) 5)  Physical Examination  (ICD-V70.0) 6)  Muscle Spasm, Back  (ICD-724.8) 7)  Vaginitis, Bacterial  (ICD-616.10) 8)  Uti  (ICD-599.0) 9)  Acute Maxillary Sinusitis  (ICD-461.0) 10)  Internal Hemorrhoids Without Mention Comp  (ICD-455.0) 11)  Tobacco Use  (ICD-305.1) 12)  Sinusitis, Acute  (ICD-461.9) 13)  Osteoarthritis  (ICD-715.90) 14)  Gerd  (ICD-530.81) 15)  Allergic Rhinitis  (ICD-477.9) 16)  Leukocytosis, Chronic  (ICD-288.8) 17)  Hyperlipidemia  (ICD-272.4)  Current Problems (verified): 1)  Hematuria Unspecified  (ICD-599.70) 2)  Chest Discomfort, Atypical  (ICD-786.59) 3)  Bronchitis, Acute  (ICD-466.0) 4)  Physical Examination  (ICD-V70.0) 5)  Muscle Spasm, Back  (ICD-724.8) 6)  Vaginitis, Bacterial  (ICD-616.10) 7)  Uti  (ICD-599.0) 8)  Acute Maxillary Sinusitis  (ICD-461.0) 9)  Internal Hemorrhoids Without Mention Comp  (ICD-455.0) 10)  Tobacco Use  (ICD-305.1) 11)  Sinusitis, Acute  (ICD-461.9) 12)   Osteoarthritis  (ICD-715.90) 13)  Gerd  (ICD-530.81) 14)  Allergic Rhinitis  (ICD-477.9) 15)  Leukocytosis, Chronic  (ICD-288.8) 16)  Hyperlipidemia  (ICD-272.4)  Medications Prior to Update: 1)  Ativan 1 Mg  Tabs (Lorazepam) .... One By Mouth Tid 2)  Prempro 0.3-1.5 Mg  Tabs (Conj Estrog-Medroxyprogest Ace) .... Once Daily 3)  Multivital   Tabs (Multiple Vitamins-Minerals) .... Once Daily 4)  Unisom 25 Mg  Tabs (Doxylamine Succinate (Sleep)) .... At Bedtime 5)  Proctosol Hc 2.5 %  Crea (Hydrocortisone) .... Apply To Rectum As Needed  Hemorrhoids 6)  Zyrtec Allergy 10 Mg Tabs (Cetirizine Hcl) .Marland Kitchen.. 1 Once Daily As Needed 7)  Mucinex Dm Maximum Strength 60-1200 Mg Xr12h-Tab (Dextromethorphan-Guaifenesin) .Marland Kitchen.. 1 Once Daily As Needed 8)  Omeprazole 40 Mg Cpdr (Omeprazole) .... One By Mouth Daily 9)  Fluticasone Propionate 50 Mcg/act Susp (Fluticasone Propionate) .... Two Spray in Nostril Daily 10)  Bactroban 2 % Oint (Mupirocin) .... Apply To Sore Tid 11)  Macrobid 100 Mg Caps (Nitrofurantoin Monohyd Macro) .... One By Mouth Two Times A Day For 7 Days  ( Generic Ok)  Current Medications (verified): 1)  Ativan 1 Mg  Tabs (Lorazepam) .... One By Mouth Tid 2)  Prempro 0.3-1.5 Mg  Tabs (Conj Estrog-Medroxyprogest Ace) .... Once Daily 3)  Multivital   Tabs (Multiple Vitamins-Minerals) .... Once Daily 4)  Unisom 25  Mg  Tabs (Doxylamine Succinate (Sleep)) .... At Bedtime 5)  Proctosol Hc 2.5 %  Crea (Hydrocortisone) .... Apply To Rectum As Needed  Hemorrhoids 6)  Zyrtec Allergy 10 Mg Tabs (Cetirizine Hcl) .Marland Kitchen.. 1 Once Daily As Needed 7)  Mucinex Dm Maximum Strength 60-1200 Mg Xr12h-Tab (Dextromethorphan-Guaifenesin) .Marland Kitchen.. 1 Once Daily As Needed 8)  Omeprazole 40 Mg Cpdr (Omeprazole) .... One By Mouth Daily 9)  Fluticasone Propionate 50 Mcg/act Susp (Fluticasone Propionate) .... Two Spray in Nostril Daily 10)  Bactroban 2 % Oint (Mupirocin) .... Apply To Sore Tid 11)  Macrobid 100 Mg Caps  (Nitrofurantoin Monohyd Macro) .... One By Mouth Two Times A Day For 7 Days  ( Generic Ok) 12)  Meloxicam 15 Mg Tabs (Meloxicam) .Marland Kitchen.. 1 Once Daily  Allergies (verified): 1)  * Fluoroquinilone Class  Past History:  Family History: Last updated: 03/24/2007 Family History of HIV Family History Kidney disease Family History of Stroke M 1st degree relative <50  Social History: Last updated: 03/24/2007 Married Retired Current Smoker Alcohol use-no Drug use-no  Risk Factors: Smoking Status: current (12/24/2009) Packs/Day: 1 (12/24/2009)  Past medical, surgical, family and social histories (including risk factors) reviewed, and no changes noted (except as noted below).  Past Medical History: Reviewed history from 08/30/2007 and no changes required. HRT Allergies Hyperlipidemia Allergic rhinitis GERD Osteoarthritis hemorrhoids  Past Surgical History: Reviewed history from 03/24/2007 and no changes required. Caesarean section x3 Tubal ligation Tonsillectomy  Family History: Reviewed history from 03/24/2007 and no changes required. Family History of HIV Family History Kidney disease Family History of Stroke M 1st degree relative <50  Social History: Reviewed history from 03/24/2007 and no changes required. Married Retired Current Smoker Alcohol use-no Drug use-no  Review of Systems  The patient denies anorexia, fever, weight loss, weight gain, vision loss, decreased hearing, hoarseness, chest pain, syncope, dyspnea on exertion, peripheral edema, prolonged cough, headaches, hemoptysis, abdominal pain, melena, hematochezia, severe indigestion/heartburn, hematuria, incontinence, genital sores, muscle weakness, suspicious skin lesions, transient blindness, difficulty walking, depression, unusual weight change, abnormal bleeding, enlarged lymph nodes, angioedema, and breast masses.    Physical Exam  General:  alert and pale.   Head:  normocephalic and atraumatic.     Eyes:  pupils equal and pupils round.   Ears:  R ear normal and L ear normal.   Nose:  no external deformity and no nasal discharge.   Mouth:  good dentition and pharynx pink and moist.   Breasts:  No mass, nodules, thickening, tenderness, bulging, retraction, inflamation, nipple discharge or skin changes noted.   Lungs:  normal respiratory effort.  increased brnchial breath sounds Heart:  normal rate and regular rhythm.   no rub heard Abdomen:  soft and non-tender.  no masses, no guarding, no rigidity, and no rebound tenderness.   Msk:  lumbar lordosis and SI joint tenderness.   Extremities:  trace left pedal edema and trace right pedal edema.   Neurologic:  alert & oriented X3 and gait normal.     Impression & Recommendations:  Problem # 1:  HEMATURIA UNSPECIFIED (ICD-599.70)  the pt had a cyst on the kidney  there was not other dz seen, the bladder was normal the [place] did not fully characteris the cyst as a simple cyst the the radilogist suggested an MRI in light of the hematuris Her updated medication list for this problem includes:    Metronidazole 500 Mg Tabs (Metronidazole) .Marland Kitchen... 1 tab by mouth three times a day x 7days  Cipro 250 Mg Tabs (Ciprofloxacin hcl) .Marland Kitchen... 1 tab by mouth two times a day x 3 days  Discussed medication use and hematuria work-up.   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 1 tab by mouth three times a day x 7days    Cipro 250 Mg Tabs (Ciprofloxacin hcl) .Marland Kitchen... 1 tab by mouth two times a day x 3 days  Encouraged to push clear liquids, get enough rest, and take acetaminophen as needed. To be seen in 10 days if no  improvement, sooner if worse.  Complete Medication List: 1)  Ativan 1 Mg Tabs (Lorazepam) .... One by mouth tid 2)  Prempro 0.3-1.5 Mg Tabs (Conj estrog-medroxyprogest ace) .... Once daily 3)  Multivital Tabs (Multiple vitamins-minerals) .... Once daily 4)  Unisom 25 Mg Tabs (Doxylamine succinate (sleep)) .... At bedtime 5)  Proctosol Hc 2.5 % Crea (Hydrocortisone) .... Apply to rectum as needed  hemorrhoids 6)  Zyrtec Allergy 10 Mg Tabs (Cetirizine hcl) .Marland Kitchen.. 1 once daily as needed 7)  Mucinex Dm Maximum Strength 60-1200 Mg Xr12h-tab (Dextromethorphan-guaifenesin) .Marland Kitchen.. 1 once daily as needed 8)  Omeprazole 40 Mg Cpdr (Omeprazole) .... One by mouth daily 9)  Fluticasone Propionate 50 Mcg/act Susp (Fluticasone propionate) .... Two spray in nostril daily 10)  Bactroban 2 % Oint (Mupirocin) .... Apply to sore tid 11)  Meloxicam 15 Mg Tabs (Meloxicam) .Marland Kitchen.. 1 once daily 12)  Tramadol Hcl 50 Mg Tabs (Tramadol hcl) .Marland Kitchen.. 1 tab by mouth three times a day as needed pain 13)  Metronidazole 500 Mg Tabs (Metronidazole) .Marland Kitchen.. 1 tab by mouth three times a day x 7days 14)  Cipro 250 Mg Tabs (Ciprofloxacin hcl) .Marland Kitchen.. 1 tab by mouth two times a day x 3 days  Patient Instructions: 1)  Please schedule a follow-up appointment in 3 months.

## 2010-08-12 NOTE — Assessment & Plan Note (Signed)
Summary: EVAL OF INSECT BITE // RS   Vital Signs:  Patient profile:   70 year old female Height:      61 inches Weight:      156 pounds BMI:     29.58 Temp:     98.2 degrees F oral Pulse rate:   76 / minute Resp:     14 per minute BP sitting:   120 / 70  (left arm)  Vitals Entered By: Willy Eddy, LPN (June 03, 2010 9:23 AM) CC: c/o lesion on rt lower leg about the size of a dime Is Patient Diabetic? No   Primary Care Provider:  Stacie Glaze MD  CC:  c/o lesion on rt lower leg about the size of a dime.  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) 4)  Hematuria Unspecified  (ICD-599.70) 5)  Chest Discomfort, Atypical  (ICD-786.59) 6)  Bronchitis, Acute  (ICD-466.0) 7)  Physical Examination  (ICD-V70.0) 8)  Muscle Spasm, Back  (ICD-724.8) 9)  Vaginitis, Bacterial  (ICD-616.10) 10)  Uti  (ICD-599.0) 11)  Acute Maxillary Sinusitis  (ICD-461.0) 12)  Internal Hemorrhoids Without Mention Comp  (ICD-455.0) 13)  Tobacco Use  (ICD-305.1) 14)  Sinusitis, Acute  (ICD-461.9) 15)  Osteoarthritis  (ICD-715.90) 16)  Gerd  (ICD-530.81) 17)  Allergic Rhinitis  (ICD-477.9) 18)  Leukocytosis, Chronic  (ICD-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.8) 19)  Hyperlipidemia  (ICD-272.4)  Current Problems (verified): 1)  Sebaceous Cyst, Infected  (ICD-706.2) 2)  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) 3)  Hematuria Unspecified  (ICD-599.70) 4)  Chest Discomfort, Atypical  (ICD-786.59) 5)  Bronchitis, Acute  (ICD-466.0) 6)  Physical  Examination  (ICD-V70.0) 7)  Muscle Spasm, Back  (ICD-724.8) 8)  Vaginitis, Bacterial  (ICD-616.10) 9)  Uti  (ICD-599.0) 10)  Acute Maxillary Sinusitis  (ICD-461.0) 11)  Internal Hemorrhoids Without Mention Comp  (ICD-455.0) 12)  Tobacco Use  (ICD-305.1) 13)  Sinusitis, Acute  (ICD-461.9) 14)  Osteoarthritis  (ICD-715.90) 15)  Gerd  (ICD-530.81) 16)  Allergic Rhinitis  (ICD-477.9) 17)  Leukocytosis, Chronic  (ICD-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 One By Mouth Tid 2)  Prempro 0.3-1.5 Mg  Tabs (Conj Estrog-Medroxyprogest Ace) .... Once Daily 3)  Multivital   Tabs (Multiple Vitamins-Minerals) .... Once Daily 4)  Unisom 25 Mg  Tabs (Doxylamine Succinate (Sleep)) .... At Bedtime 5)  Proctosol Hc 2.5 %  Crea (Hydrocortisone) .... Apply To Rectum As Needed  Hemorrhoids 6)  Zyrtec Allergy 10 Mg Tabs (Cetirizine Hcl) .Marland Kitchen.. 1 Once Daily As Needed 7)  Mucinex Dm Maximum Strength 60-1200 Mg Xr12h-Tab (Dextromethorphan-Guaifenesin) .Marland Kitchen.. 1 Once Daily As Needed 8)  Omeprazole 40 Mg Cpdr (Omeprazole) .... One By Mouth Daily 9)  Fluticasone Propionate 50 Mcg/act Susp (Fluticasone Propionate) .... Two Spray in Nostril Daily 10)  Bactroban 2 % Oint (Mupirocin) .... Apply To Sore Tid 11)  Septra Ds 800-160 Mg Tabs (Sulfamethoxazole-Trimethoprim) .... One By Mouth Two Times  A Day For 14 Days 12)  Meloxicam 15 Mg Tabs (Meloxicam) .Marland Kitchen.. 1 Once Daily As Needed 13)  Fluconazole 150 Mg Tabs (Fluconazole) .... One By Mouth Now  Current Medications (verified): 1)  Ativan 1 Mg  Tabs (Lorazepam) .... One By Mouth Tid 2)  Prempro 0.3-1.5 Mg  Tabs (Conj Estrog-Medroxyprogest Ace) .... Once Daily 3)  Multivital   Tabs (Multiple Vitamins-Minerals) .... Once Daily 4)  Unisom 25 Mg  Tabs (Doxylamine Succinate (Sleep)) .... At Bedtime 5)  Proctosol Hc 2.5 %  Crea (Hydrocortisone) .... Apply To Rectum As Needed  Hemorrhoids 6)  Zyrtec Allergy 10  Mg Tabs (Cetirizine Hcl) .Marland Kitchen.. 1 Once Daily As Needed 7)  Mucinex Dm Maximum Strength 60-1200 Mg Xr12h-Tab (Dextromethorphan-Guaifenesin) .Marland Kitchen.. 1 Once Daily As Needed 8)  Omeprazole 40 Mg Cpdr (Omeprazole) .... One By Mouth Daily 9)  Fluticasone Propionate 50 Mcg/act Susp (Fluticasone Propionate) .... Two Spray in Nostril Daily 10)  Meloxicam 15 Mg Tabs (Meloxicam) .Marland Kitchen.. 1 Once Daily As Needed  Allergies (verified): 1)  * Fluoroquinilone Class  Past History:  Family History: Last updated: 03/24/2007 Family History of HIV Family History Kidney disease Family History of Stroke M 1st degree relative <50  Social History: Last updated: 03/24/2007 Married Retired Current Smoker Alcohol use-no Drug use-no  Risk Factors: Smoking Status: current (06/03/2010) Packs/Day: 1 (06/03/2010)  Past medical, surgical, family and social histories (including risk factors) reviewed, and no changes noted (except as noted below).  Past Medical History: Reviewed history from 08/30/2007 and no changes required. HRT Allergies Hyperlipidemia Allergic rhinitis GERD Osteoarthritis hemorrhoids  Past Surgical History: Reviewed history from 03/24/2007 and no changes required. Caesarean section x3 Tubal ligation Tonsillectomy  Family History: Reviewed history from 03/24/2007 and no changes required. Family History of HIV Family History Kidney disease Family History of Stroke M 1st degree relative <50  Social History: Reviewed history from 03/24/2007 and no changes required. Married Retired Current Smoker Alcohol use-no Drug use-no  Review of Systems       The patient complains of suspicious skin lesions.  The patient denies anorexia, fever, weight loss, weight gain, vision loss, decreased hearing, hoarseness, chest pain, syncope, dyspnea on exertion, peripheral edema, prolonged cough, headaches, hemoptysis, abdominal pain, melena, hematochezia, severe indigestion/heartburn, hematuria,  incontinence, genital sores, muscle weakness, transient blindness, difficulty walking, depression, unusual weight change, abnormal bleeding, enlarged lymph nodes, angioedema, and breast masses.    Physical Exam  General:  Well-developed,well-nourished,in no acute distress; alert,appropriate and cooperative throughout examination Head:  Normocephalic and atraumatic without obvious abnormalities Eyes:  pupils equal and pupils round.   Ears:  R ear normal and L ear normal.   Nose:  no external deformity and no nasal discharge.   Mouth:  Oral mucosa and oropharynx without lesions Neck:  No deformities, masses, or tenderness noted. Lungs:  Normal respiratory effort, chest expands symmetrically. Lungs are clear to auscultation, no crackles or wheezes. Heart:  Normal rate and regular rhythm. S1 and S2 normal without gallop, click, rub or other extra sounds. Abdomen:  Bowel sounds positive,abdomen soft and non-tender without masses, organomegaly or hernias noted. Msk:  pain with palpation over paravertebral muscles in lower lumbar region and over right posterior sacroiliac joint. Pulses:  R and L carotid,radial,femoral,dorsalis pedis and posterior tibial pulses are full and equal bilaterally Extremities:  No clubbing, cyanosis, edema Neurologic:  alert & oriented X3 and gait normal.     Impression & Recommendations:  Problem # 1:  MUSCLE SPASM, BACK (ICD-724.8) improved Her updated  medication list for this problem includes:    Meloxicam 15 Mg Tabs (Meloxicam) .Marland Kitchen... 1 once daily as needed  Discussed use of moist heat or ice, modified activities, medications, and stretching/strengthening exercises. Back care instructions given. To be seen in 2 weeks if no improvement; sooner if worsening of symptoms.   Problem # 2:  OTHER SEBORRHEIC DERMATITIS (ICD-690.18) the pt has a small spot of sebborheic dermatitis on left ankle continue topical care and reassurance  Complete Medication List: 1)  Ativan  1 Mg Tabs (Lorazepam) .... One by mouth tid 2)  Prempro 0.3-1.5 Mg Tabs (Conj estrog-medroxyprogest ace) .... Once daily 3)  Multivital Tabs (Multiple vitamins-minerals) .... Once daily 4)  Unisom 25 Mg Tabs (Doxylamine succinate (sleep)) .... At bedtime 5)  Proctosol Hc 2.5 % Crea (Hydrocortisone) .... Apply to rectum as needed  hemorrhoids 6)  Zyrtec Allergy 10 Mg Tabs (Cetirizine hcl) .Marland Kitchen.. 1 once daily as needed 7)  Mucinex Dm Maximum Strength 60-1200 Mg Xr12h-tab (Dextromethorphan-guaifenesin) .Marland Kitchen.. 1 once daily as needed 8)  Omeprazole 40 Mg Cpdr (Omeprazole) .... One by mouth daily 9)  Fluticasone Propionate 50 Mcg/act Susp (Fluticasone propionate) .... Two spray in nostril daily 10)  Meloxicam 15 Mg Tabs (Meloxicam) .Marland Kitchen.. 1 once daily as needed  Patient Instructions: 1)  cpx in jan   Orders Added: 1)  Est. Patient Level III [16109]

## 2010-08-12 NOTE — Progress Notes (Signed)
Summary: back pain, is UTI still present?  Phone Note Call from Patient Call back at [phone]   Caller: Patient Call For: Stacie Glaze MD/Engelbert Sevin Summary of Call: Still having pain in mid back, questioning if her UTI is not completely gone, discomfort is simular. Should she bring by another urine sample, or OV [facility] Initial call taken by: Sid Falcon LPN,  January 02, 2010 5:15 PM  Follow-up for Phone Call        if someone has available OV today---schedule  if not, then urine culture Follow-up by: Birdie Sons MD,  January 03, 2010 12:16 PM  Additional Follow-up for Phone Call Additional follow up Details #1::        Dr Abner Greenspan is able to see pt today, schedulers will schedule Additional Follow-up by: Sid Falcon LPN,  January 03, 2010 12:39 PM     Appended Document: back pain, is UTI still present? Pt was called back and coming in today to see Dr. Abner Greenspan

## 2010-08-12 NOTE — Miscellaneous (Signed)
Summary: Orders Update   Clinical Lists Changes  Orders: Added new Referral order of Radiology Referral (Radiology) - Signed 

## 2010-08-12 NOTE — Assessment & Plan Note (Signed)
Summary: ?uti ok per Dr. Ricardo Jericho   Vital Signs:  Patient profile:   70 year old female Weight:      155 pounds (70.45 kg) O2 Sat:      97 % on Room air Temp:     98.5 degrees F (36.94 degrees C) Pulse rate:   107 / minute BP sitting:   130 / 80  (left arm) Cuff size:   regular  Vitals Entered By: Kathrynn Speed CMA (January 03, 2010 3:07 PM)  O2 Flow:  Room air CC: UTI ? Plank back pain & pain in righ side for 5 days? /src   History of Present Illness: Patient in today with recurrent low back pain/anorexia/nausea/malaise and concerned about whether she has another UTI and/or vaginitis. She was treated with Macrobid earlier in the month and that helped her symptoms somewhat but now they have worsened again. She notes having similar symptoms years ago and being diagnosed with BV then being treated with Metronidazole and feeling better. She is denying any significant discharge but has had some mild pruritus. No dysuria but did note some burning of her vaginal mucosa some time in the last week. No increase in her blood sugars/f/c/CP/SOB. No radicular symptoms or incontinence. She does note a long history of reflux and dyspepsia and is maintained on Omeprazole which does help but she does wake up each am with sour taste and fluid in her throat and on further questioning she reports a distant history of being told she had early ulcers and having a good response to therapy.  Current Medications (verified): 1)  Ativan 1 Mg  Tabs (Lorazepam) .... One By Mouth Tid 2)  Prempro 0.3-1.5 Mg  Tabs (Conj Estrog-Medroxyprogest Ace) .... Once Daily 3)  Multivital   Tabs (Multiple Vitamins-Minerals) .... Once Daily 4)  Unisom 25 Mg  Tabs (Doxylamine Succinate (Sleep)) .... At Bedtime 5)  Proctosol Hc 2.5 %  Crea (Hydrocortisone) .... Apply To Rectum As Needed  Hemorrhoids 6)  Zyrtec Allergy 10 Mg Tabs (Cetirizine Hcl) .Marland Kitchen.. 1 Once Daily As Needed 7)  Mucinex Dm Maximum Strength 60-1200 Mg Xr12h-Tab  (Dextromethorphan-Guaifenesin) .Marland Kitchen.. 1 Once Daily As Needed 8)  Omeprazole 40 Mg Cpdr (Omeprazole) .... One By Mouth Daily 9)  Fluticasone Propionate 50 Mcg/act Susp (Fluticasone Propionate) .... Two Spray in Nostril Daily 10)  Bactroban 2 % Oint (Mupirocin) .... Apply To Sore Tid 11)  Meloxicam 15 Mg Tabs (Meloxicam) .Marland Kitchen.. 1 Once Daily  Allergies (verified): 1)  * Fluoroquinilone Class  Past History:  Past medical history reviewed for relevance to current acute and chronic problems. Social history (including risk factors) reviewed for relevance to current acute and chronic problems.  Past Medical History: Reviewed history from 08/30/2007 and no changes required. HRT Allergies Hyperlipidemia Allergic rhinitis GERD Osteoarthritis hemorrhoids  Social History: Reviewed history from 03/24/2007 and no changes required. Married Retired Current Smoker Alcohol use-no Drug use-no  Review of Systems      See HPI  Physical Exam  General:  Well-developed,well-nourished,in no acute distress; alert,appropriate and cooperative throughout examination Head:  Normocephalic and atraumatic without obvious abnormalities Mouth:  Oral mucosa and oropharynx without lesions Neck:  No deformities, masses, or tenderness noted. Lungs:  Normal respiratory effort, chest expands symmetrically. Lungs are clear to auscultation, no crackles or wheezes. Heart:  Normal rate and regular rhythm. S1 and S2 normal without gallop, click, rub or other extra sounds. Abdomen:  Bowel sounds positive,abdomen soft and non-tender without masses, organomegaly or hernias noted. Msk:  pain with palpation over paravertebral muscles in lower lumbar region and over right posterior sacroiliac joint. Extremities:  No clubbing, cyanosis, edema Psych:  Cognition and judgment appear intact. Alert and cooperative with normal attention span and concentration. Anxious   Impression & Recommendations:  Problem # 1:  HEMATURIA  UNSPECIFIED (ICD-599.70)  The following medications were removed from the medication list:    Macrobid 100 Mg Caps (Nitrofurantoin monohyd macro) ..... One by mouth two times a day for 7 days  ( generic ok) Her updated medication list for this problem includes:    Metronidazole 500 Mg Tabs (Metronidazole) .Marland Kitchen... 1 tab by mouth three times a day x 7days    Cipro 250 Mg Tabs (Ciprofloxacin hcl) .Marland Kitchen... 1 tab by mouth two times a day x 3 days  Orders: T-Urine Culture (Spectrum Order) [id number]) Prescription Created Electronically [phone])  Patient in today with worsening low back pain R>L. She notes similar pain when treated earlier in month with Macrobid and treated in past with Flagyl for BV. Will treat with short course of Cipro and Flagyl and send urine for culture and if neg needs repeat UA in 1 mn if hematuria and pain are persistent would consider Urology referral  Discussed medication use and hematuria work-up.   Problem # 2:  MUSCLE SPASM, BACK (ICD-724.8)  Her updated medication list for this problem includes:    Meloxicam 15 Mg Tabs (Meloxicam) .Marland Kitchen... 1 once daily    Tramadol Hcl 50 Mg Tabs (Tramadol hcl) .Marland Kitchen... 1 tab by mouth three times a day as needed pain  Orders: TLB-CBC Platelet - w/Differential (85025-CBCD) TLB-Sedimentation Rate (ESR) (85652-ESR) Venipuncture (91478) Prescription Created Electronically [phone]) Does have some increased pain with palpation over lower lumbar spine and right sacroiliac joint. Consider dx of sacroilietis as a contributing factor and use Meloxicam daily and Tramadol as needed, apply moist heat as tolerated  Problem # 3:  GERD (ICD-530.81)  Her updated medication list for this problem includes:    Omeprazole 40 Mg Cpdr (Omeprazole) ..... One by mouth daily  Orders: TLB-H. Pylori Abs(Helicobacter Pylori) (86677-HELICO) Venipuncture (13086) Patient with persistent dyspepsia, sour taste in throat in am and distant h/o "early ulcers" per  patient will r/o H Pylori infection today and cont Omeprazole  Problem # 4:  TOBACCO USE (ICD-305.1) Patient smoking 1/4 ppd, encouraged complete cessation  Complete Medication List: 1)  Ativan 1 Mg Tabs (Lorazepam) .... One by mouth tid 2)  Prempro 0.3-1.5 Mg Tabs (Conj estrog-medroxyprogest ace) .... Once daily 3)  Multivital Tabs (Multiple vitamins-minerals) .... Once daily 4)  Unisom 25 Mg Tabs (Doxylamine succinate (sleep)) .... At bedtime 5)  Proctosol Hc 2.5 % Crea (Hydrocortisone) .... Apply to rectum as needed  hemorrhoids 6)  Zyrtec Allergy 10 Mg Tabs (Cetirizine hcl) .Marland Kitchen.. 1 once daily as needed 7)  Mucinex Dm Maximum Strength 60-1200 Mg Xr12h-tab (Dextromethorphan-guaifenesin) .Marland Kitchen.. 1 once daily as needed 8)  Omeprazole 40 Mg Cpdr (Omeprazole) .... One by mouth daily 9)  Fluticasone Propionate 50 Mcg/act Susp (Fluticasone propionate) .... Two spray in nostril daily 10)  Bactroban 2 % Oint (Mupirocin) .... Apply to sore tid 11)  Meloxicam 15 Mg Tabs (Meloxicam) .Marland Kitchen.. 1 once daily 12)  Tramadol Hcl 50 Mg Tabs (Tramadol hcl) .Marland Kitchen.. 1 tab by mouth three times a day as needed pain 13)  Metronidazole 500 Mg Tabs (Metronidazole) .Marland Kitchen.. 1 tab by mouth three times a day x 7days 14)  Cipro 250 Mg Tabs (Ciprofloxacin hcl) .Marland Kitchen.. 1 tab by mouth  two times a day x 3 days  Patient Instructions: 1)  Please schedule a follow-up appointment in 1 month.  2)  Urine- dip prior to visit ICD-9 : 599.7 3)  Take full course of meds and call if symptoms worsen or any concerns wil check urine for blood again prior to next visit and if it persists will need to consider referral to Urologist ( Doctor who takes care of bladder) Prescriptions: CIPRO 250 MG TABS (CIPROFLOXACIN HCL) 1 tab by mouth two times a day x 3 days  #6 x 0   Entered and Authorized by:   Danise Edge MD   Signed by:   Danise Edge MD on 01/03/2010   Method used:   Electronically to        [id number] (retail)       [address]       [place], [place]  [address]       Ph: [phone] or [phone]       Fax: [phone]   RxID:   [id number] METRONIDAZOLE 500 MG TABS (METRONIDAZOLE) 1 tab by mouth three times a day x 7days  #21 x 0   Entered and Authorized by:   Danise Edge MD   Signed by:   Danise Edge MD on 01/03/2010   Method used:   Electronically to        [id number] (retail)       [address]       [place], [place]  [address]       Ph: [phone] or [phone]       Fax: [phone]   RxID:   [id number] TRAMADOL HCL 50 MG TABS (TRAMADOL HCL) 1 tab by mouth three times a day as needed pain  #40 x 1   Entered and Authorized by:   Danise Edge MD   Signed by:   Danise Edge MD on 01/03/2010   Method used:   Electronically to        [id number] (retail)       [address]       [place], [place]  [address]       Ph: [phone] or [phone]       Fax: [phone]   RxID:   [id number]   Laboratory Results   Urine Tests   Date/Time Reported: Kathrynn Speed CMA  January 03, 2010 3:19 PM     Routine Urinalysis   Color: lt. yellow Appearance: Clear Glucose: negative   (Normal Range: Negative) Bilirubin: negative   (Normal Range: Negative) Ketone: negative   (Normal Range: Negative) Spec. Gravity: 1.000   (Normal Range: 1.003-1.035) Blood: moderate   (Normal Range: Negative) pH: 6.0   (Normal Range: 5.0-8.0) Protein: negative   (Normal Range: Negative) Urobilinogen: 0.2   (Normal Range: 0-1) Nitrite: negative   (Normal Range: Negative) Leukocyte Esterace: negative   (Normal Range: Negative)    Comments: Kathrynn Speed CMA  January 03, 2010 3:19 PM

## 2010-08-12 NOTE — Assessment & Plan Note (Signed)
Summary: 3 month fup//ccm   Vital Signs:  Patient profile:   70 year old female Height:      61 inches Weight:      152 pounds BMI:     28.82 Temp:     98.2 degrees F oral Pulse rate:   72 / minute Resp:     14 per minute BP sitting:   130 / 80  (left arm)  Vitals Entered By: Willy Eddy, LPN (September 23, 2009 11:18 AM)  Nutrition Counseling: Patient's BMI is greater than 25 and therefore counseled on weight management options. CC: roa med review   CC:  roa med review.  History of Present Illness: follow up for chronic problems has post nasal drainage and using cue tips in her ears neck pain in the morning when she wakes up the neck pain has been going on for severla months and the use of biofreeze makes it works has been limiting the use  take the meloxicam every day and this is not helping dicussed the use of the hydrocodone ear congestion  Preventive Screening-Counseling & Management  Alcohol-Tobacco     Smoking Status: current     Smoking Cessation Counseling: yes     Packs/Day: 1  Problems Prior to Update: 1)  Chest Discomfort, Atypical  (ICD-786.59) 2)  Bronchitis, Acute  (ICD-466.0) 3)  Physical Examination  (ICD-V70.0) 4)  Muscle Spasm, Back  (ICD-724.8) 5)  Vaginitis, Bacterial  (ICD-616.10) 6)  Uti  (ICD-599.0) 7)  Acute Maxillary Sinusitis  (ICD-461.0) 8)  Internal Hemorrhoids Without Mention Comp  (ICD-455.0) 9)  Tobacco Use  (ICD-305.1) 10)  Sinusitis, Acute  (ICD-461.9) 11)  Osteoarthritis  (ICD-715.90) 12)  Gerd  (ICD-530.81) 13)  Allergic Rhinitis  (ICD-477.9) 14)  Leukocytosis, Chronic  (ICD-288.8) 15)  Hyperlipidemia  (ICD-272.4)  Current Problems (verified): 1)  Chest Discomfort, Atypical  (ICD-786.59) 2)  Bronchitis, Acute  (ICD-466.0) 3)  Physical Examination  (ICD-V70.0) 4)  Muscle Spasm, Back  (ICD-724.8) 5)  Vaginitis, Bacterial  (ICD-616.10) 6)  Uti  (ICD-599.0) 7)  Acute Maxillary Sinusitis  (ICD-461.0) 8)  Internal  Hemorrhoids Without Mention Comp  (ICD-455.0) 9)  Tobacco Use  (ICD-305.1) 10)  Sinusitis, Acute  (ICD-461.9) 11)  Osteoarthritis  (ICD-715.90) 12)  Gerd  (ICD-530.81) 13)  Allergic Rhinitis  (ICD-477.9) 14)  Leukocytosis, Chronic  (ICD-288.8) 15)  Hyperlipidemia  (ICD-272.4)  Medications Prior to Update: 1)  Ativan 1 Mg  Tabs (Lorazepam) .... One By Mouth Tid 2)  Prempro 0.3-1.5 Mg  Tabs (Conj Estrog-Medroxyprogest Ace) .... Once Daily 3)  Mobic 15 Mg  Tabs (Meloxicam) .... As Needed 4)  Multivital   Tabs (Multiple Vitamins-Minerals) .... Once Daily 5)  Unisom 25 Mg  Tabs (Doxylamine Succinate (Sleep)) .... At Bedtime 6)  Proctosol Hc 2.5 %  Crea (Hydrocortisone) .... Apply To Rectum As Needed  Hemorrhoids 7)  Zyrtec Allergy 10 Mg Tabs (Cetirizine Hcl) .Marland Kitchen.. 1 Once Daily As Needed 8)  Mucinex Dm Maximum Strength 60-1200 Mg Xr12h-Tab (Dextromethorphan-Guaifenesin) .Marland Kitchen.. 1 Once Daily As Needed 9)  Omeprazole 40 Mg Cpdr (Omeprazole) .... One By Mouth Daily 10)  Hydrocodone-Acetaminophen 5-325 Mg Tabs (Hydrocodone-Acetaminophen) .Marland Kitchen.. 1 Every 6-8 Hours As Needed Pain 11)  Fluticasone Propionate 50 Mcg/act Susp (Fluticasone Propionate) .... Two Spray in Nostril Daily 12)  Septra Ds 800-160 Mg Tabs (Sulfamethoxazole-Trimethoprim) .Marland Kitchen.. 1 Two Times A Day  Current Medications (verified): 1)  Ativan 1 Mg  Tabs (Lorazepam) .... One By Mouth Tid 2)  Prempro 0.3-1.5 Mg  Tabs (Conj Estrog-Medroxyprogest Ace) .... Once Daily 3)  Multivital   Tabs (Multiple Vitamins-Minerals) .... Once Daily 4)  Unisom 25 Mg  Tabs (Doxylamine Succinate (Sleep)) .... At Bedtime 5)  Proctosol Hc 2.5 %  Crea (Hydrocortisone) .... Apply To Rectum As Needed  Hemorrhoids 6)  Zyrtec Allergy 10 Mg Tabs (Cetirizine Hcl) .Marland Kitchen.. 1 Once Daily As Needed 7)  Mucinex Dm Maximum Strength 60-1200 Mg Xr12h-Tab (Dextromethorphan-Guaifenesin) .Marland Kitchen.. 1 Once Daily As Needed 8)  Omeprazole 40 Mg Cpdr (Omeprazole) .... One By Mouth Daily 9)   Fluticasone Propionate 50 Mcg/act Susp (Fluticasone Propionate) .... Two Spray in Nostril Daily 10)  Etodolac 300 Mg Caps (Etodolac) .... One By Mouth Two Times A Day  Replaces The Meloxicam For The Arthritis 11)  Carisoprodol 350 Mg Tabs (Carisoprodol) .... One By Mouth Nq Hs 12)  Voltaren 1 % Gel (Diclofenac Sodium) .... Aplly To Joints  Two Times A Day Prn  Allergies (verified): 1)  * Fluoroquinilone Class  Past History:  Family History: Last updated: 03/24/2007 Family History of HIV Family History Kidney disease Family History of Stroke M 1st degree relative <50  Social History: Last updated: 03/24/2007 Married Retired Current Smoker Alcohol use-no Drug use-no  Risk Factors: Smoking Status: current (09/23/2009) Packs/Day: 1 (09/23/2009)  Past medical, surgical, family and social histories (including risk factors) reviewed, and no changes noted (except as noted below).  Past Medical History: Reviewed history from 08/30/2007 and no changes required. HRT Allergies Hyperlipidemia Allergic rhinitis GERD Osteoarthritis hemorrhoids  Past Surgical History: Reviewed history from 03/24/2007 and no changes required. Caesarean section x3 Tubal ligation Tonsillectomy  Family History: Reviewed history from 03/24/2007 and no changes required. Family History of HIV Family History Kidney disease Family History of Stroke M 1st degree relative <50  Social History: Reviewed history from 03/24/2007 and no changes required. Married Retired Current Smoker Alcohol use-no Drug use-no  Review of Systems       The patient complains of decreased hearing and hoarseness.  The patient denies anorexia, fever, weight loss, weight gain, vision loss, chest pain, syncope, dyspnea on exertion, peripheral edema, prolonged cough, headaches, hemoptysis, abdominal pain, melena, hematochezia, severe indigestion/heartburn, hematuria, incontinence, genital sores, muscle weakness, suspicious  skin lesions, transient blindness, difficulty walking, depression, unusual weight change, abnormal bleeding, enlarged lymph nodes, angioedema, and breast masses.         ear pain  Physical Exam  General:  alert and pale.   Head:  normocephalic and atraumatic.   Eyes:  pupils equal and pupils round.   Ears:  R ear normal and L ear normal.   Nose:  no external deformity and no nasal discharge.   Mouth:  good dentition and pharynx pink and moist.   Neck:  supple.  no masses.   Lungs:  normal respiratory effort.  increased brnchial breath sounds Heart:  normal rate and regular rhythm.   no rub heard Abdomen:  soft and non-tender.   Msk:  lumbar lordosis and SI joint tenderness.   Extremities:  trace left pedal edema and trace right pedal edema.   Neurologic:  alert & oriented X3 and gait normal.     Impression & Recommendations:  Problem # 1:  MUSCLE SPASM, BACK (ICD-724.8)  The following medications were removed from the medication list:    Mobic 15 Mg Tabs (Meloxicam) .Marland Kitchen... As needed    Hydrocodone-acetaminophen 5-325 Mg Tabs (Hydrocodone-acetaminophen) .Marland Kitchen... 1 every 6-8 hours as needed pain Her updated medication list for this problem includes:  Etodolac 300 Mg Caps (Etodolac) ..... One by mouth two times a day  replaces the meloxicam for the arthritis    Carisoprodol 350 Mg Tabs (Carisoprodol) ..... One by mouth nq hs  Discussed use of moist heat or ice, modified activities, medications, and stretching/strengthening exercises. Back care instructions given. To be seen in 2 weeks if no improvement; sooner if worsening of symptoms.   Problem # 2:  GERD (ICD-530.81) Assessment: Unchanged  Her updated medication list for this problem includes:    Omeprazole 40 Mg Cpdr (Omeprazole) ..... One by mouth daily  Labs Reviewed: Hgb: 13.9 (06/03/2009)   Hct: 41.0 (06/03/2009)  Problem # 3:  HYPERLIPIDEMIA (ICD-272.4)  Labs Reviewed: SGOT: 23 (06/03/2009)   SGPT: 17  (06/03/2009)  Lipid Goals: Chol Goal: 200 (03/28/2007)   HDL Goal: 40 (03/28/2007)   LDL Goal: 130 (03/28/2007)   TG Goal: 150 (03/28/2007)  Prior 10 Yr Risk Heart Disease: Not enough information (04/23/2008)   HDL:47.80 (06/03/2009), 51.80 (01/03/2009)  LDL:130 (06/03/2009), 134 (07/18/2008)  Chol:198 (06/03/2009), 210 (01/03/2009)  Trig:102.0 (06/03/2009), 116.0 (01/03/2009)  Problem # 4:  ALLERGIC RHINITIS (ICD-477.9)  Her updated medication list for this problem includes:    Zyrtec Allergy 10 Mg Tabs (Cetirizine hcl) .Marland Kitchen... 1 once daily as needed    Fluticasone Propionate 50 Mcg/act Susp (Fluticasone propionate) .Marland Kitchen..Marland Kitchen Two spray in nostril daily  Discussed use of allergy medications and environmental measures.   Complete Medication List: 1)  Ativan 1 Mg Tabs (Lorazepam) .... One by mouth tid 2)  Prempro 0.3-1.5 Mg Tabs (Conj estrog-medroxyprogest ace) .... Once daily 3)  Multivital Tabs (Multiple vitamins-minerals) .... Once daily 4)  Unisom 25 Mg Tabs (Doxylamine succinate (sleep)) .... At bedtime 5)  Proctosol Hc 2.5 % Crea (Hydrocortisone) .... Apply to rectum as needed  hemorrhoids 6)  Zyrtec Allergy 10 Mg Tabs (Cetirizine hcl) .Marland Kitchen.. 1 once daily as needed 7)  Mucinex Dm Maximum Strength 60-1200 Mg Xr12h-tab (Dextromethorphan-guaifenesin) .Marland Kitchen.. 1 once daily as needed 8)  Omeprazole 40 Mg Cpdr (Omeprazole) .... One by mouth daily 9)  Fluticasone Propionate 50 Mcg/act Susp (Fluticasone propionate) .... Two spray in nostril daily 10)  Etodolac 300 Mg Caps (Etodolac) .... One by mouth two times a day  replaces the meloxicam for the arthritis 11)  Carisoprodol 350 Mg Tabs (Carisoprodol) .... One by mouth nq hs 12)  Voltaren 1 % Gel (Diclofenac sodium) .... Aplly to joints  two times a day prn  Patient Instructions: 1)  Please schedule a follow-up appointment in 3 months. Prescriptions: VOLTAREN 1 % GEL (DICLOFENAC SODIUM) aplly to joints  two times a day prn  #1 unit x 11   Entered  and Authorized by:   Stacie Glaze MD   Signed by:   Stacie Glaze MD on 09/23/2009   Method used:   Electronically to        [id number] (retail)       [address]       [place], [place]  [address]       Ph: [phone] or [phone]       Fax: [phone]   RxID:   [id number] CARISOPRODOL 350 MG TABS (CARISOPRODOL) one by mouth nq HS  #30 x 11   Entered and Authorized by:   Stacie Glaze MD   Signed by:   Stacie Glaze MD on 09/23/2009   Method used:   Electronically to        [id number] (retail)  [address]       [place], [place]  [address]       Ph: [phone] or [phone]       Fax: [phone]   RxID:   [id number] ETODOLAC 300 MG CAPS (ETODOLAC) one by mouth two times a day  replaces the meloxicam for the arthritis  #60 x 11   Entered and Authorized by:   Stacie Glaze MD   Signed by:   Stacie Glaze MD on 09/23/2009   Method used:   Electronically to        [facility] (retail)       [address]       [place], [place]  [address]       Ph: [phone] or [phone]       Fax: [phone]   RxID:   [id number] FLUTICASONE PROPIONATE 50 MCG/ACT SUSP (FLUTICASONE PROPIONATE) two spray in nostril daily  #1 unit x 11   Entered and Authorized by:   Stacie Glaze MD   Signed by:   Stacie Glaze MD on 09/23/2009   Method used:   Electronically to        [facility] (retail)       [address]       [place], [place]  [address]       Ph: [phone] or [phone]       Fax: [phone]   RxID:   [id number] PROCTOSOL HC 2.5 %  CREA (HYDROCORTISONE) apply to rectum as needed  hemorrhoids  #1 unit x 11   Entered and Authorized by:   Stacie Glaze MD   Signed by:   Stacie Glaze MD on 09/23/2009   Method used:   Electronically to        [facility] (retail)       [address]       [place], [place]  [address]       Ph: [phone] or [phone]       Fax: [phone]   RxID:    [id number] OMEPRAZOLE 40 MG CPDR (OMEPRAZOLE) one by mouth daily  #30 Capsule x 5   Entered by:   Willy Eddy, LPN   Authorized by:   Stacie Glaze MD   Signed by:   Stacie Glaze MD on 09/23/2009   Method used:   Electronically to        [facility] [id number]* (retail)       [address] [place], [place]  [address]       Ph: [phone]       Fax: [phone]   RxID:   [id number] ATIVAN 1 MG  TABS (LORAZEPAM) one by mouth TID  #90 x 11   Entered and Authorized by:   Stacie Glaze MD   Signed by:   Stacie Glaze MD on 09/23/2009   Method used:   Print then Give to Patient   RxID:   [id number]

## 2010-08-12 NOTE — Assessment & Plan Note (Signed)
Summary: fu per pt'/njr   Vital Signs:  Patient profile:   70 year old female Weight:      158 pounds Temp:     98.6 degrees F oral Pulse rate:   78 / minute Pulse rhythm:   regular Resp:     12 per minute BP sitting:   132 / 68  Vitals Entered By: Lynann Beaver CMA (April 14, 2010 12:41 PM) CC: rov, small infected lesion on old excision of CSection, Back pain Pain Assessment Patient in pain? no        Primary Care Kaytlyn Din:  Stacie Glaze MD  CC:  rov, small infected lesion on old excision of CSection, and Back pain.  History of Present Illness: Back pain is worse that ever..  does not radiate ( hx of radiation to the left) The pain is worse with bending and lifting the pt was drainage at the site of her c section scar she notes spasm in the back in the thoracic area and states that she hears her back pop. She developed diarrhea with etodolac and the meloxicam does not help.   Back Pain      This is a 70 year old woman who presents with Back pain.  The patient denies fever, chills, weakness, loss of sensation, fecal incontinence, urinary incontinence, urinary retention, dysuria, rest pain, inability to work, and inability to care for self.  The pain is located in the mid low back and mid thoracic back.  The pain began gradually.  The pain is made better by inactivity, muscle relaxants, and heat.  Risk factors for serious underlying conditions include duration of pain > 1 month and age >= 50 years.    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) 3)  Hematuria Unspecified  (ICD-599.70) 4)  Chest Discomfort, Atypical  (ICD-786.59) 5)  Bronchitis, Acute  (ICD-466.0) 6)  Physical Examination  (ICD-V70.0) 7)  Muscle Spasm, Back  (ICD-724.8) 8)  Vaginitis,  Bacterial  (ICD-616.10) 9)  Uti  (ICD-599.0) 10)  Acute Maxillary Sinusitis  (ICD-461.0) 11)  Internal Hemorrhoids Without Mention Comp  (ICD-455.0) 12)  Tobacco Use  (ICD-305.1) 13)  Sinusitis, Acute  (ICD-461.9) 14)  Osteoarthritis  (ICD-715.90) 15)  Gerd  (ICD-530.81) 16)  Allergic Rhinitis  (ICD-477.9) 17)  Leukocytosis, Chronic  (ICD-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 One By Mouth Tid 2)  Prempro 0.3-1.5 Mg  Tabs (Conj Estrog-Medroxyprogest Ace) .... Once Daily 3)  Multivital   Tabs (Multiple Vitamins-Minerals) .... Once Daily 4)  Unisom 25 Mg  Tabs (Doxylamine Succinate (Sleep)) .... At Bedtime 5)  Proctosol Hc 2.5 %  Crea (Hydrocortisone) .... Apply To Rectum As Needed  Hemorrhoids 6)  Zyrtec Allergy 10 Mg Tabs (Cetirizine Hcl) .Marland Kitchen.. 1 Once Daily As Needed 7)  Mucinex Dm Maximum Strength 60-1200 Mg Xr12h-Tab (Dextromethorphan-Guaifenesin) .Marland Kitchen.. 1 Once Daily As Needed 8)  Omeprazole 40 Mg Cpdr (Omeprazole) .... One By Mouth Daily 9)  Fluticasone Propionate 50 Mcg/act Susp (Fluticasone Propionate) .... Two Spray in Nostril Daily 10)  Bactroban 2 % Oint (Mupirocin) .... Apply To Sore Tid 11)  Meloxicam 15 Mg Tabs (Meloxicam) .Marland Kitchen.. 1 Once Daily 12)  Tramadol Hcl 50 Mg Tabs (Tramadol  Hcl) .... 1 Tab By Mouth Three Times A Day As Needed Pain 13)  Metronidazole 500 Mg Tabs (Metronidazole) .Marland Kitchen.. 1 Tab By Mouth Three Times A Day X 7days 14)  Cipro 250 Mg Tabs (Ciprofloxacin Hcl) .Marland Kitchen.. 1 Tab By Mouth Two Times A Day X 3 Days  Current Medications (verified): 1)  Ativan 1 Mg  Tabs (Lorazepam) .... One By Mouth Tid 2)  Prempro 0.3-1.5 Mg  Tabs (Conj Estrog-Medroxyprogest Ace) .... Once Daily 3)  Multivital   Tabs (Multiple Vitamins-Minerals) .... Once Daily 4)  Unisom 25 Mg  Tabs (Doxylamine Succinate (Sleep)) .... At Bedtime 5)  Proctosol Hc 2.5 %  Crea (Hydrocortisone) .... Apply To Rectum As Needed  Hemorrhoids 6)   Zyrtec Allergy 10 Mg Tabs (Cetirizine Hcl) .Marland Kitchen.. 1 Once Daily As Needed 7)  Mucinex Dm Maximum Strength 60-1200 Mg Xr12h-Tab (Dextromethorphan-Guaifenesin) .Marland Kitchen.. 1 Once Daily As Needed 8)  Omeprazole 40 Mg Cpdr (Omeprazole) .... One By Mouth Daily 9)  Fluticasone Propionate 50 Mcg/act Susp (Fluticasone Propionate) .... Two Spray in Nostril Daily 10)  Bactroban 2 % Oint (Mupirocin) .... Apply To Sore Tid 11)  Septra Ds 800-160 Mg Tabs (Sulfamethoxazole-Trimethoprim) .... One By Mouth Two Times A Day For 14 Days 12)  Durabac 325-250-20-50 Mg Caps (Apap-Salicyl-Phenyltolox-Caff) .... One By Mouth Two Times A Day As Needed Back Pain  Allergies (verified): 1)  * Fluoroquinilone Class  Past History:  Family History: Last updated: 03/24/2007 Family History of HIV Family History Kidney disease Family History of Stroke M 1st degree relative <50  Social History: Last updated: 03/24/2007 Married Retired Current Smoker Alcohol use-no Drug use-no  Risk Factors: Smoking Status: current (04/14/2010) Packs/Day: 1 (12/24/2009)  Past medical, surgical, family and social histories (including risk factors) reviewed, and no changes noted (except as noted below).  Past Medical History: Reviewed history from 08/30/2007 and no changes required. HRT Allergies Hyperlipidemia Allergic rhinitis GERD Osteoarthritis hemorrhoids  Past Surgical History: Reviewed history from 03/24/2007 and no changes required. Caesarean section x3 Tubal ligation Tonsillectomy  Family History: Reviewed history from 03/24/2007 and no changes required. Family History of HIV Family History Kidney disease Family History of Stroke M 1st degree relative <50  Social History: Reviewed history from 03/24/2007 and no changes required. Married Retired Current Smoker Alcohol use-no Drug use-no  Review of Systems       The patient complains of dyspnea on exertion.  The patient denies anorexia, fever, weight loss,  weight gain, vision loss, decreased hearing, hoarseness, chest pain, syncope, peripheral edema, prolonged cough, headaches, hemoptysis, abdominal pain, melena, hematochezia, severe indigestion/heartburn, hematuria, incontinence, genital sores, muscle weakness, suspicious skin lesions, transient blindness, difficulty walking, depression, unusual weight change, abnormal bleeding, enlarged lymph nodes, angioedema, and breast masses.    Physical Exam  General:  Well-developed,well-nourished,in no acute distress; alert,appropriate and cooperative throughout examination Head:  Normocephalic and atraumatic without obvious abnormalities Eyes:  pupils equal and pupils round.   Ears:  R ear normal and L ear normal.   Nose:  no external deformity and no nasal discharge.   Mouth:  Oral mucosa and oropharynx without lesions Neck:  No deformities, masses, or tenderness noted. Lungs:  Normal respiratory effort, chest expands symmetrically. Lungs are clear to auscultation, no crackles or wheezes. Heart:  Normal rate and regular rhythm. S1 and S2 normal without gallop, click, rub or other extra sounds. Abdomen:  Bowel sounds positive,abdomen soft and non-tender without masses, organomegaly or hernias noted.   Impression & Recommendations:  Problem # 1:  SEBACEOUS CYST, INFECTED (ICD-706.2)  on abdominal wall culture obtained will rx with  Orders: Specimen Handling (30160) T-Culture, Wound ([id number])  Problem # 2:  MUSCLE SPASM, BACK (ICD-724.8)  The following medications were removed from the medication list:    Meloxicam 15 Mg Tabs (Meloxicam) .Marland Kitchen... 1 once daily    Tramadol Hcl 50 Mg Tabs (Tramadol hcl) .Marland Kitchen... 1 tab by mouth three times a day as needed pain Her updated medication list for this problem includes:    Meloxicam 15 Mg Tabs (Meloxicam) .Marland Kitchen... 1 once daily as needed  Discussed use of moist heat or ice, modified activities, medications, and stretching/strengthening exercises. Back  care instructions given. To be seen in 2 weeks if no improvement; sooner if worsening of symptoms.   Orders: T-Thoracic Spine 2 Views [id number]) T-Lumbar Spine 2 Views ([id number])  Problem # 3:  TOBACCO USE (ICD-305.1)  Encouraged smoking cessation and discussed different methods for smoking cessation.   Problem # 4:  OSTEOARTHRITIS (ICD-715.90)  The following medications were removed from the medication list:    Meloxicam 15 Mg Tabs (Meloxicam) .Marland Kitchen... 1 once daily    Tramadol Hcl 50 Mg Tabs (Tramadol hcl) .Marland Kitchen... 1 tab by mouth three times a day as needed pain Her updated medication list for this problem includes:    Meloxicam 15 Mg Tabs (Meloxicam) .Marland Kitchen... 1 once daily as needed  Discussed use of medications, application of heat or cold, and exercises.   Complete Medication List: 1)  Ativan 1 Mg Tabs (Lorazepam) .... One by mouth tid 2)  Prempro 0.3-1.5 Mg Tabs (Conj estrog-medroxyprogest ace) .... Once daily 3)  Multivital Tabs (Multiple vitamins-minerals) .... Once daily 4)  Unisom 25 Mg Tabs (Doxylamine succinate (sleep)) .... At bedtime 5)  Proctosol Hc 2.5 % Crea (Hydrocortisone) .... Apply to rectum as needed  hemorrhoids 6)  Zyrtec Allergy 10 Mg Tabs (Cetirizine hcl) .Marland Kitchen.. 1 once daily as needed 7)  Mucinex Dm Maximum Strength 60-1200 Mg Xr12h-tab (Dextromethorphan-guaifenesin) .Marland Kitchen.. 1 once daily as needed 8)  Omeprazole 40 Mg Cpdr (Omeprazole) .... One by mouth daily 9)  Fluticasone Propionate 50 Mcg/act Susp (Fluticasone propionate) .... Two spray in nostril daily 10)  Bactroban 2 % Oint (Mupirocin) .... Apply to sore tid 11)  Septra Ds 800-160 Mg Tabs (Sulfamethoxazole-trimethoprim) .... One by mouth two times a day for 14 days 12)  Meloxicam 15 Mg Tabs (Meloxicam) .Marland Kitchen.. 1 once daily as needed 13)  Fluconazole 150 Mg Tabs (Fluconazole) .... One by mouth now  Other Orders: Flu Vaccine 15yrs + MEDICARE PATIENTS (T7322) Administration Flu vaccine - MCR (G2542) Flu Vaccine Consent  Questions     Do you have a history of severe allergic reactions to this vaccine? no    Any prior history of allergic reactions to egg and/or gelatin? no    Do you have a sensitivity to the preservative Thimersol? no    Do you have a past history of Guillan-Barre Syndrome? no    Do you currently have an acute febrile illness? no    Have you ever had a severe reaction to latex? no    Vaccine information given and explained to patient? yes    Are you currently pregnant? no    Lot Number:[id number]   Exp Date:01/10/2011   Site Given  Left Deltoid IM  Patient Instructions: 1)  get the back films and try the new medcation for pain 2)  Take your antibiotic as prescribed until ALL of it is gone, but stop if you develop  a rash or swelling and contact our office as soon as possible. Prescriptions: DURABAC 325-250-20-50 MG CAPS (APAP-SALICYL-PHENYLTOLOX-CAFF) one by mouth two times a day as needed back pain  #60 x 3   Entered and Authorized by:   Stacie Glaze MD   Signed by:   Stacie Glaze MD on 04/14/2010   Method used:   Electronically to        [id number] (retail)       [address]       [place], [place]  [address]       Ph: [phone] or [phone]       Fax: [phone]   RxID:   [id number] SEPTRA DS 800-160 MG TABS (SULFAMETHOXAZOLE-TRIMETHOPRIM) one by mouth two times a day for 14 days  #28 x 0   Entered and Authorized by:   Stacie Glaze MD   Signed by:   Stacie Glaze MD on 04/14/2010   Method used:   Electronically to        [id number] (retail)       [address]       [place], [place]  [address]       Ph: [phone] or [phone]       Fax: [phone]   RxID:   [id number]    .lbmedflu

## 2010-08-12 NOTE — Progress Notes (Signed)
Summary: requesting alternate rx  Phone Note Call from Patient Call back at Home Phone [phone]   Caller: Patient----live call Reason for Call: Acute Illness Summary of Call: The medicine that was rx'd on yesterday to help her sleep and for pain, isn't helping.  Only slept 4 hours. [facility] for something else. Initial call taken by: Warnell Forester,  September 24, 2009 11:09 AM  Follow-up for Phone Call        pleasr give it time. It may take a few nights before it starts working Follow-up by: Willy Eddy, LPN,  September 24, 2009 11:14 AM  Additional Follow-up for Phone Call Additional follow up Details #1::        Pt. advised. Additional Follow-up by: Lynann Beaver CMA,  September 24, 2009 11:51 AM

## 2010-08-14 NOTE — Assessment & Plan Note (Signed)
Summary: cpx--//ccm   Vital Signs:  Patient profile:   70 year old female Height:      61 inches Weight:      156 pounds BMI:     29.58 Temp:     98.2 degrees F oral Pulse rate:   72 / minute Resp:     14 per minute BP sitting:   132 / 80  (left arm)  Vitals Entered By: Willy Eddy, LPN (July 21, 2010 2:27 PM) CC: annual visit for disease management--no colonoscopy or bone desnity, Lipid Management Is Patient Diabetic? No   Primary Care Provider:  Stacie Glaze MD  CC:  annual visit for disease management--no colonoscopy or bone desnity and Lipid Management.  History of Present Illness: The pt was asked about all immunizations, health maint. services that are appropriate to their age and was given guidance on diet exercize  and weight management  recent exam at GYN revealed breast cyst ( L at 9 )   Lipid Management History:      Positive NCEP/ATP III risk factors include female age 58 years old or older and current tobacco user.  Negative NCEP/ATP III risk factors include no history of early menopause without estrogen hormone replacement, non-diabetic, no family history for ischemic heart disease, non-hypertensive, no ASHD (atherosclerotic heart disease), no prior stroke/TIA, no peripheral vascular disease, and no history of aortic aneurysm.     Preventive Screening-Counseling & Management  Alcohol-Tobacco     Smoking Status: current     Smoking Cessation Counseling: yes     Packs/Day: 1     Tobacco Counseling: to quit use of tobacco products  Problems Prior to Update: 1)  Other Seborrheic Dermatitis  (ICD-690.18) 2)  Sebaceous Cyst, Infected  (ICD-706.2) 3)  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) 4)  Hematuria Unspecified  (ICD-599.70) 5)  Chest Discomfort, Atypical  (ICD-786.59) 6)  Bronchitis, Acute  (ICD-466.0) 7)  Physical Examination  (ICD-V70.0) 8)  Muscle Spasm, Back  (ICD-724.8) 9)  Vaginitis, Bacterial  (ICD-616.10) 10)  Uti  (ICD-599.0) 11)   Acute Maxillary Sinusitis  (ICD-461.0) 12)  Internal Hemorrhoids Without Mention Comp  (ICD-455.0) 13)  Tobacco Use  (ICD-305.1) 14)  Sinusitis, Acute  (ICD-461.9) 15)  Osteoarthritis  (ICD-715.90) 16)  Gerd  (ICD-530.81) 17)  Allergic Rhinitis  (ICD-477.9) 18)  Leukocytosis, Chronic  (ICD-288.8) 19)  Hyperlipidemia  (ICD-272.4)  Current Problems (verified): 1)  Other Seborrheic Dermatitis  (ICD-690.18) 2)  Sebaceous Cyst, Infected  (ICD-706.2) 3)  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) 4)  Hematuria Unspecified  (ICD-599.70) 5)  Chest Discomfort, Atypical  (ICD-786.59) 6)  Bronchitis, Acute  (ICD-466.0) 7)  Physical Examination  (ICD-V70.0) 8)  Muscle Spasm, Back  (ICD-724.8) 9)  Vaginitis, Bacterial  (ICD-616.10) 10)  Uti  (ICD-599.0) 11)  Acute Maxillary Sinusitis  (ICD-461.0) 12)  Internal Hemorrhoids Without Mention Comp  (ICD-455.0) 13)  Tobacco Use  (ICD-305.1) 14)  Sinusitis, Acute  (ICD-461.9) 15)  Osteoarthritis  (ICD-715.90) 16)  Gerd  (ICD-530.81) 17)  Allergic Rhinitis  (ICD-477.9) 18)  Leukocytosis, Chronic  (ICD-288.8) 19)  Hyperlipidemia  (ICD-272.4)  Medications Prior to Update: 1)  Ativan 1 Mg  Tabs (Lorazepam) .... One By Mouth Tid 2)  Prempro 0.3-1.5 Mg  Tabs (Conj Estrog-Medroxyprogest Ace) .... Once Daily 3)  Multivital   Tabs (Multiple Vitamins-Minerals) .... Once Daily 4)  Unisom 25 Mg  Tabs (Doxylamine Succinate (Sleep)) .... At Bedtime 5)  Proctosol Hc 2.5 %  Crea (Hydrocortisone) .... Apply  To Rectum As Needed  Hemorrhoids 6)  Zyrtec Allergy 10 Mg Tabs (Cetirizine Hcl) .Marland Kitchen.. 1 Once Daily As Needed 7)  Mucinex Dm Maximum Strength 60-1200 Mg Xr12h-Tab (Dextromethorphan-Guaifenesin) .Marland Kitchen.. 1 Once Daily As Needed 8)  Omeprazole 40 Mg Cpdr (Omeprazole) .... One By Mouth Daily 9)  Fluticasone Propionate 50 Mcg/act Susp (Fluticasone Propionate) .... Two Spray in Nostril Daily 10)  Meloxicam 15 Mg Tabs (Meloxicam) .Marland Kitchen.. 1 Once Daily As Needed  Current  Medications (verified): 1)  Ativan 1 Mg  Tabs (Lorazepam) .... One By Mouth Tid 2)  Prempro 0.3-1.5 Mg  Tabs (Conj Estrog-Medroxyprogest Ace) .... Once Daily 3)  Multivital   Tabs (Multiple Vitamins-Minerals) .... Once Daily 4)  Unisom 25 Mg  Tabs (Doxylamine Succinate (Sleep)) .... At Bedtime 5)  Proctosol Hc 2.5 %  Crea (Hydrocortisone) .... Apply To Rectum As Needed  Hemorrhoids 6)  Zyrtec Allergy 10 Mg Tabs (Cetirizine Hcl) .Marland Kitchen.. 1 Once Daily As Needed 7)  Omeprazole 40 Mg Cpdr (Omeprazole) .... One By Mouth Daily 8)  Fluticasone Propionate 50 Mcg/act Susp (Fluticasone Propionate) .... Two Spray in Nostril Daily 9)  Meloxicam 15 Mg Tabs (Meloxicam) .Marland Kitchen.. 1 Once Daily As Needed  Allergies (verified): 1)  * Fluoroquinilone Class  Past History:  Family History: Last updated: 03/24/2007 Family History of HIV Family History Kidney disease Family History of Stroke M 1st degree relative <50  Social History: Last updated: 03/24/2007 Married Retired Current Smoker Alcohol use-no Drug use-no  Risk Factors: Smoking Status: current (07/21/2010) Packs/Day: 1 (07/21/2010)  Past medical, surgical, family and social histories (including risk factors) reviewed, and no changes noted (except as noted below).  Past Medical History: Reviewed history from 08/30/2007 and no changes required. HRT Allergies Hyperlipidemia Allergic rhinitis GERD Osteoarthritis hemorrhoids  Past Surgical History: Reviewed history from 03/24/2007 and no changes required. Caesarean section x3 Tubal ligation Tonsillectomy  Family History: Reviewed history from 03/24/2007 and no changes required. Family History of HIV Family History Kidney disease Family History of Stroke M 1st degree relative <50  Social History: Reviewed history from 03/24/2007 and no changes required. Married Retired Current Smoker Alcohol use-no Drug use-no  Review of Systems  The patient denies anorexia, fever, weight  loss, weight gain, vision loss, decreased hearing, hoarseness, chest pain, syncope, dyspnea on exertion, peripheral edema, prolonged cough, headaches, hemoptysis, abdominal pain, melena, hematochezia, severe indigestion/heartburn, hematuria, incontinence, genital sores, muscle weakness, suspicious skin lesions, transient blindness, difficulty walking, depression, unusual weight change, abnormal bleeding, enlarged lymph nodes, angioedema, and breast masses.    Physical Exam  General:  Well-developed,well-nourished,in no acute distress; alert,appropriate and cooperative throughout examination Head:  Normocephalic and atraumatic without obvious abnormalities Eyes:  pupils equal and pupils round.   Ears:  R ear normal and L ear normal.   Nose:  no external deformity and no nasal discharge.   Mouth:  Oral mucosa and oropharynx without lesions Neck:  No deformities, masses, or tenderness noted. Breasts:  L breast mass and L breast tender.   Lungs:  Normal respiratory effort, chest expands symmetrically. Lungs are clear to auscultation, no crackles or wheezes. Heart:  Normal rate and regular rhythm. S1 and S2 normal without gallop, click, rub or other extra sounds. Abdomen:  Bowel sounds positive,abdomen soft and non-tender without masses, organomegaly or hernias noted. Msk:  pain with palpation over paravertebral muscles in lower lumbar region and over right posterior sacroiliac joint. Pulses:  R and L carotid,radial,femoral,dorsalis pedis and posterior tibial pulses are full and equal bilaterally Extremities:  No clubbing, cyanosis, edema Neurologic:  alert & oriented X3 and gait normal.   Cervical Nodes:  No lymphadenopathy noted Axillary Nodes:  No palpable lymphadenopathy Inguinal Nodes:  No significant adenopathy Psych:  Cognition and judgment appear intact. Alert and cooperative with normal attention span and concentration. Anxious   Impression & Recommendations:  Problem # 1:  PHYSICAL  EXAMINATION (ICD-V70.0) The pt was asked about all immunizations, health maint. services that are appropriate to their age and was given guidance on diet exercize  and weight management  Mammogram: normal (09/09/2008) Pap smear: normal (09/09/2008) Td Booster: Td (04/23/2008)   Flu Vax: Fluvax 3+ (04/14/2010)   Pneumovax: Historical (07/13/2005) Chol: 195 (07/15/2010)   HDL: 49.60 (07/15/2010)   LDL: 124 (07/15/2010)   TG: 107.0 (07/15/2010) TSH: 1.68 (07/15/2010)   Next mammogram due:: 09/2009 (06/10/2009)  Discussed using sunscreen, use of alcohol, drug use, self breast exam, routine dental care, routine eye care, schedule for GYN exam, routine physical exam, seat belts, multiple vitamins, osteoporosis prevention, adequate calcium intake in diet, recommendations for immunizations, mammograms and Pap smears.  Discussed exercise and checking cholesterol.  Discussed gun safety, safe sex, and contraception.  Problem # 2:  BREAST CYST, LEFT (ICD-610.0) mobile at 9 oclock sent for [place] appears benign  Complete Medication List: 1)  Ativan 1 Mg Tabs (Lorazepam) .... One by mouth tid 2)  Prempro 0.3-1.5 Mg Tabs (Conj estrog-medroxyprogest ace) .... Once daily 3)  Multivital Tabs (Multiple vitamins-minerals) .... Once daily 4)  Unisom 25 Mg Tabs (Doxylamine succinate (sleep)) .... At bedtime 5)  Proctosol Hc 2.5 % Crea (Hydrocortisone) .... Apply to rectum as needed  hemorrhoids 6)  Zyrtec Allergy 10 Mg Tabs (Cetirizine hcl) .Marland Kitchen.. 1 once daily as needed 7)  Omeprazole 40 Mg Cpdr (Omeprazole) .... One by mouth daily 8)  Fluticasone Propionate 50 Mcg/act Susp (Fluticasone propionate) .... Two spray in nostril daily 9)  Meloxicam 15 Mg Tabs (Meloxicam) .Marland Kitchen.. 1 once daily as needed  Lipid Assessment/Plan:      Based on NCEP/ATP III, the patient's risk factor category is "2 or more risk factors and a calculated 10 year CAD risk of < 20%".  The patient's lipid goals are as follows: Total cholesterol goal is  200; LDL cholesterol goal is 130; HDL cholesterol goal is 40; Triglyceride goal is 150.  Her LDL cholesterol goal has been met.    Patient Instructions: 1)   you need to walk on the treadmil every day 2)  Please schedule a follow-up appointment in 4 months. Prescriptions: OMEPRAZOLE 40 MG CPDR (OMEPRAZOLE) one by mouth daily  #30 Capsule x 6   Entered by:   Willy Eddy, LPN   Authorized by:   Stacie Glaze MD   Signed by:   Willy Eddy, LPN on 44/09/4740   Method used:   Electronically to        [id number] ([facility])       [address]       [place], [place]  [address]       Ph: [phone] or [phone]       Fax: [phone]   RxID:   [id number]    Orders Added: 1)  Est. Patient 65& > [76283] 2)  Est. Patient Level II [15176]

## 2010-10-27 ENCOUNTER — Other Ambulatory Visit: Payer: Self-pay | Admitting: Internal Medicine

## 2010-11-13 ENCOUNTER — Encounter: Payer: Self-pay | Admitting: Internal Medicine

## 2010-11-19 ENCOUNTER — Encounter: Payer: Self-pay | Admitting: Internal Medicine

## 2010-11-19 ENCOUNTER — Ambulatory Visit (INDEPENDENT_AMBULATORY_CARE_PROVIDER_SITE_OTHER): Payer: PRIVATE HEALTH INSURANCE | Admitting: Internal Medicine

## 2010-11-19 DIAGNOSIS — E785 Hyperlipidemia, unspecified: Secondary | ICD-10-CM

## 2010-11-19 DIAGNOSIS — K219 Gastro-esophageal reflux disease without esophagitis: Secondary | ICD-10-CM

## 2010-11-19 DIAGNOSIS — F17200 Nicotine dependence, unspecified, uncomplicated: Secondary | ICD-10-CM

## 2010-11-19 DIAGNOSIS — D7289 Other specified disorders of white blood cells: Secondary | ICD-10-CM

## 2010-11-19 LAB — CBC WITH DIFFERENTIAL/PLATELET
Basophils Absolute: 0 10*3/uL (ref 0.0–0.1)
Eosinophils Absolute: 0.1 10*3/uL (ref 0.0–0.7)
Eosinophils Relative: 0.5 % (ref 0.0–5.0)
MCV: 93 fl (ref 78.0–100.0)
Monocytes Absolute: 0.7 10*3/uL (ref 0.1–1.0)
Neutrophils Relative %: 81.2 % — ABNORMAL HIGH (ref 43.0–77.0)
Platelets: 352 10*3/uL (ref 150.0–400.0)
WBC: 15.6 10*3/uL — ABNORMAL HIGH (ref 4.5–10.5)

## 2010-11-19 NOTE — Assessment & Plan Note (Signed)
Her total cholesterol is 195 and has been stable for the past year her HDL is excellent at 50 and her LDL C. was 124 which is her lowest monitored we will consider repeating cholesterol levels in the fall

## 2010-11-19 NOTE — Progress Notes (Signed)
  Subjective:    Patient ID: Sonya Franco, female    DOB: [birth year], 70 y.o.   MRN: [id number]  HPI The patient has severe hip pain that is influencing her ability to walk and exercise she takes Advil for this pain.  She continues to smoke less than a pack a day we counseled her about this. We noted an elevated leukocytosis and she will have followup CBC differential today. Otherwise she is stable doing well blood pressure is well controlled weight is slightly up from previous readings  .     Review of Systems  Constitutional: Negative for activity change, appetite change and fatigue.  HENT: Negative for ear pain, congestion, neck pain, postnasal drip and sinus pressure.   Eyes: Negative for redness and visual disturbance.  Respiratory: Negative for cough, shortness of breath and wheezing.   Gastrointestinal: Negative for abdominal pain and abdominal distention.  Genitourinary: Negative for dysuria, frequency and menstrual problem.  Musculoskeletal: Negative for myalgias, joint swelling and arthralgias.  Skin: Negative for rash and wound.  Neurological: Negative for dizziness, weakness and headaches.  Hematological: Negative for adenopathy. Does not bruise/bleed easily.  Psychiatric/Behavioral: Negative for sleep disturbance and decreased concentration.       Past Medical History  Diagnosis Date  . Postmenopausal HRT (hormone replacement therapy)   . Allergy   . Hyperlipidemia   . GERD (gastroesophageal reflux disease)   . Arthritis   . Hemorrhoids    Past Surgical History  Procedure Date  . Cesarean section   . 3 times   . Cesarean section     3 times  . Tubal ligation   . Tonsillectomy     reports that she has been smoking.  She does not have any smokeless tobacco history on file. She reports that she does not drink alcohol or use illicit drugs. family history includes HIV in an unspecified family member; Hypertension in her father; Multiple sclerosis in her  mother; and Stroke in her father. No Known Allergies  Objective:   Physical Exam  Constitutional: She is oriented to person, place, and time. She appears well-developed and well-nourished. No distress.  HENT:  Head: Normocephalic and atraumatic.  Right Ear: External ear normal.  Left Ear: External ear normal.  Nose: Nose normal.  Mouth/Throat: Oropharynx is clear and moist.  Eyes: Conjunctivae and EOM are normal. Pupils are equal, round, and reactive to light.  Neck: Normal range of motion. Neck supple. No JVD present. No tracheal deviation present. No thyromegaly present.  Cardiovascular: Normal rate, regular rhythm, normal heart sounds and intact distal pulses.   No murmur heard. Pulmonary/Chest: Effort normal and breath sounds normal. She has no wheezes. She exhibits no tenderness.  Abdominal: Soft. Bowel sounds are normal.  Musculoskeletal: Normal range of motion. She exhibits no edema and no tenderness.  Lymphadenopathy:    She has no cervical adenopathy.  Neurological: She is alert and oriented to person, place, and time. She has normal reflexes. No cranial nerve deficit.  Skin: Skin is warm and dry. She is not diaphoretic.  Psychiatric: She has a normal mood and affect. Her behavior is normal.          Assessment & Plan:  The patient's weight is relatively stable her blood pressure is excellent she does have arthritic complaints and uses meloxicam. Her heartburn is stable and we're monitoring her white cell count today

## 2010-12-02 ENCOUNTER — Other Ambulatory Visit: Payer: Self-pay | Admitting: Internal Medicine

## 2010-12-04 ENCOUNTER — Other Ambulatory Visit: Payer: Self-pay | Admitting: Internal Medicine

## 2010-12-11 ENCOUNTER — Ambulatory Visit (INDEPENDENT_AMBULATORY_CARE_PROVIDER_SITE_OTHER): Payer: PRIVATE HEALTH INSURANCE | Admitting: Internal Medicine

## 2010-12-11 ENCOUNTER — Encounter: Payer: Self-pay | Admitting: Internal Medicine

## 2010-12-11 ENCOUNTER — Ambulatory Visit (INDEPENDENT_AMBULATORY_CARE_PROVIDER_SITE_OTHER)
Admission: RE | Admit: 2010-12-11 | Discharge: 2010-12-11 | Disposition: A | Discharge status: Home / Self Care | Payer: Medicare Other | Source: Ambulatory Visit | Attending: Internal Medicine | Admitting: Internal Medicine

## 2010-12-11 VITALS — BP 120/78 | HR 76 | Temp 98.6°F | Resp 16 | Ht 61.0 in | Wt 161.0 lb

## 2010-12-11 DIAGNOSIS — D72829 Elevated white blood cell count, unspecified: Secondary | ICD-10-CM

## 2010-12-11 DIAGNOSIS — R05 Cough: Secondary | ICD-10-CM

## 2010-12-11 DIAGNOSIS — M130 Polyarthritis, unspecified: Secondary | ICD-10-CM

## 2010-12-11 DIAGNOSIS — L719 Rosacea, unspecified: Secondary | ICD-10-CM

## 2010-12-11 DIAGNOSIS — J309 Allergic rhinitis, unspecified: Secondary | ICD-10-CM

## 2010-12-11 DIAGNOSIS — T887XXA Unspecified adverse effect of drug or medicament, initial encounter: Secondary | ICD-10-CM

## 2010-12-11 DIAGNOSIS — T2124XA Burn of second degree of lower back, initial encounter: Secondary | ICD-10-CM

## 2010-12-11 LAB — SEDIMENTATION RATE: Sed Rate: 12 mm/hr (ref 0–22)

## 2010-12-11 LAB — BASIC METABOLIC PANEL
BUN: 14 mg/dL (ref 6–23)
Calcium: 9.5 mg/dL (ref 8.4–10.5)
Creatinine, Ser: 0.9 mg/dL (ref 0.4–1.2)
GFR: 67.18 mL/min (ref >60.00)
Glucose, Bld: 99 mg/dL (ref 70–99)
Sodium: 136 mEq/L (ref 135–145)

## 2010-12-11 MED ORDER — DICLOFENAC SODIUM 50 MG PO TBEC: 50.0000 mg | DELAYED_RELEASE_TABLET | Freq: Two times a day (BID) | ORAL | Status: DC

## 2010-12-11 MED ORDER — CLINDAMYCIN PHOSPHATE 1 % EX SOLN: Freq: Two times a day (BID) | CUTANEOUS | Status: DC

## 2010-12-11 MED ORDER — DOXYCYCLINE HYCLATE 100 MG PO TABS: 100.0000 mg | ORAL_TABLET | Freq: Two times a day (BID) | ORAL | Status: AC

## 2010-12-11 MED ORDER — FLUTICASONE PROPIONATE 50 MCG/ACT NA SUSP: 2.0000 | Freq: Every day | NASAL | Status: DC
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
## 2010-12-12 LAB — CYCLIC CITRUL PEPTIDE ANTIBODY, IGG: Cyclic Citrullin Peptide Ab: <2 U/mL (ref 0.0–5.0)

## 2010-12-16 ENCOUNTER — Other Ambulatory Visit: Payer: Self-pay | Admitting: *Deleted

## 2010-12-16 MED ORDER — ALBUTEROL 90 MCG/ACT IN AERS: 2.0000 | INHALATION_SPRAY | Freq: Two times a day (BID) | RESPIRATORY_TRACT | Status: DC

## 2010-12-16 NOTE — Progress Notes (Signed)
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

## 2010-12-23 ENCOUNTER — Telehealth: Payer: Self-pay | Admitting: *Deleted

## 2010-12-23 NOTE — Telephone Encounter (Signed)
Pt thinks she has a yeast infection from the Doxycycline.  Will try Monistat OTC.

## 2011-01-09 ENCOUNTER — Telehealth: Payer: Self-pay | Admitting: Internal Medicine

## 2011-01-09 MED ORDER — LORAZEPAM 1 MG PO TABS: 1.0000 mg | ORAL_TABLET | Freq: Three times a day (TID) | ORAL | Status: DC

## 2011-01-09 NOTE — Telephone Encounter (Signed)
Refill Lorazepam to [facility].

## 2011-02-03 ENCOUNTER — Other Ambulatory Visit (INDEPENDENT_AMBULATORY_CARE_PROVIDER_SITE_OTHER): Payer: Medicare Other

## 2011-02-03 DIAGNOSIS — E785 Hyperlipidemia, unspecified: Secondary | ICD-10-CM

## 2011-02-10 ENCOUNTER — Encounter: Payer: Self-pay | Admitting: Internal Medicine

## 2011-02-10 ENCOUNTER — Ambulatory Visit (INDEPENDENT_AMBULATORY_CARE_PROVIDER_SITE_OTHER): Payer: PRIVATE HEALTH INSURANCE | Admitting: Internal Medicine

## 2011-02-10 VITALS — BP 130/80 | HR 76 | Temp 98.2°F | Resp 16 | Ht 61.0 in | Wt 162.0 lb

## 2011-02-10 DIAGNOSIS — F411 Generalized anxiety disorder: Secondary | ICD-10-CM

## 2011-02-10 DIAGNOSIS — F419 Anxiety disorder, unspecified: Secondary | ICD-10-CM

## 2011-02-10 DIAGNOSIS — F17200 Nicotine dependence, unspecified, uncomplicated: Secondary | ICD-10-CM

## 2011-02-10 DIAGNOSIS — K219 Gastro-esophageal reflux disease without esophagitis: Secondary | ICD-10-CM

## 2011-02-10 DIAGNOSIS — D7289 Other specified disorders of white blood cells: Secondary | ICD-10-CM

## 2011-02-10 DIAGNOSIS — I10 Essential (primary) hypertension: Secondary | ICD-10-CM

## 2011-02-10 DIAGNOSIS — E785 Hyperlipidemia, unspecified: Secondary | ICD-10-CM

## 2011-02-10 LAB — CBC WITH DIFFERENTIAL/PLATELET
Basophils Relative: 0.4 % (ref 0.0–3.0)
Eosinophils Relative: 0.6 % (ref 0.0–5.0)
HCT: 45.5 % (ref 36.0–46.0)
Hemoglobin: 14.8 g/dL (ref 12.0–15.0)
Lymphs Abs: 2.6 10*3/uL (ref 0.7–4.0)
MCV: 93.6 fl (ref 78.0–100.0)
Monocytes Absolute: 0.6 10*3/uL (ref 0.1–1.0)
Monocytes Relative: 4.7 % (ref 3.0–12.0)
RBC: 4.86 Mil/uL (ref 3.87–5.11)
WBC: 13.3 10*3/uL — ABNORMAL HIGH (ref 4.5–10.5)

## 2011-02-10 LAB — BASIC METABOLIC PANEL
BUN: 10 mg/dL (ref 6–23)
CO2: 27 mEq/L (ref 19–32)
Calcium: 9.4 mg/dL (ref 8.4–10.5)
GFR: 78.33 mL/min (ref >60.00)
Glucose, Bld: 64 mg/dL — ABNORMAL LOW (ref 70–99)
Potassium: 4.2 mEq/L (ref 3.5–5.1)

## 2011-02-10 MED ORDER — SERTRALINE HCL 25 MG PO TABS: 25.0000 mg | ORAL_TABLET | Freq: Every day | ORAL | Status: DC

## 2011-02-10 NOTE — Assessment & Plan Note (Signed)
Counseled about smoking

## 2011-02-10 NOTE — Patient Instructions (Signed)
Smoking Cessation This document explains the best ways for you to quit smoking and new treatments to help. It lists new medicines that can double or triple your chances of quitting and quitting for good. It also considers ways to avoid relapses and concerns you may have about quitting, including weight gain. NICOTINE: A POWERFUL ADDICTION If you have tried to quit smoking, you know how hard it can be. It is hard because nicotine is a very addictive drug. For some people, it can be as addictive as heroin or cocaine. Usually, people make 2 or 3 tries, or more, before finally being able to quit. Each time you try to quit, you can learn about what helps and what hurts. Quitting takes hard work and a lot of effort, but you can quit smoking. QUITTING SMOKING IS ONE OF THE MOST IMPORTANT THINGS YOU WILL EVER DO:  You will live longer, feel better, and live better.   The impact on your body of quitting smoking is felt almost immediately:   Within 20 minutes, blood pressure decreases. Pulse returns to its normal level.   After 8 hours, carbon monoxide levels in the blood return to normal. Oxygen level increases.   After 24 hours, chance of heart attack starts to decrease. Breath, hair, and body stop smelling like smoke.   After 48 hours, damaged nerve endings begin to recover. Sense of taste and smell improve.   After 72 hours, the body is virtually free of nicotine. Bronchial tubes relax and breathing becomes easier.   After 2 to 12 weeks, lungs can hold more air. Exercise becomes easier and circulation improves.   Quitting will lower your chance of having a heart attack, stroke, cancer, or lung disease:   After 1 year, the risk of coronary heart disease is cut in half.   After 5 years, the risk of stroke falls to the same as a nonsmoker.   After 10 years, the risk of lung cancer is cut in half and the risk of other cancers decreases significantly.   After 15 years, the risk of coronary heart  disease drops, usually to the level of a nonsmoker.   If you are pregnant, quitting smoking will improve your chances of having a healthy baby.   The people you live with, especially your children, will be healthier.   You will have extra money to spend on things other than cigarettes.  FIVE KEYS TO QUITTING Studies have shown that these 5 steps will help you quit smoking and quit for good. You have the best chances of quitting if you use them together: 1. Get ready.  2. Get support and encouragement.  3. Learn new skills and behaviors.  4. Get medicine to reduce your nicotine addiction and use it correctly.  5. Be prepared for relapse or difficult situations. Be determined to continue trying to quit, even if you do not succeed at first.  1. GET READY  Set a quit date.   Change your environment.   Get rid of ALL cigarettes, ashtrays, matches, and lighters in your home, car, and place of work.   Do not let people smoke in your home.   Review your past attempts to quit. Think about what worked and what did not.   Once you quit, do not smoke. NOT EVEN A PUFF!  2. GET SUPPORT AND ENCOURAGEMENT Studies have shown that you have a better chance of being successful if you have help. You can get support in many ways.  Tell   your family, friends, and coworkers that you are going to quit and need their support. Ask them not to smoke around you.   Talk to your caregivers (doctor, dentist, nurse, pharmacist, psychologist, and/or smoking counselor).   Get individual, group, or telephone counseling and support. The more counseling you have, the better your chances are of quitting. Programs are available at local hospitals and health centers. Call your local health department for information about programs in your area.   Spiritual beliefs and practices may help some smokers quit.   Quit meters are small computer programs online or downloadable that keep track of quit statistics, such as amount  of "quit-time," cigarettes not smoked, and money saved.   Many smokers find one or more of the many self-help books available useful in helping them quit and stay off tobacco.  3. LEARN NEW SKILLS AND BEHAVIORS  Try to distract yourself from urges to smoke. Talk to someone, go for a walk, or occupy your time with a task.   When you first try to quit, change your routine. Take a different route to work. Drink tea instead of coffee. Eat breakfast in a different place.   Do something to reduce your stress. Take a hot bath, exercise, or read a book.   Plan something enjoyable to do every day. Reward yourself for not smoking.   Explore interactive web-based programs that specialize in helping you quit.  4. GET MEDICINE AND USE IT CORRECTLY Medicines can help you stop smoking and decrease the urge to smoke. Combining medicine with the above behavioral methods and support can quadruple your chances of successfully quitting smoking. The [place] Food and Drug Administration (FDA) has approved 7 medicines to help you quit smoking. These medicines fall into 3 categories.  Nicotine replacement therapy (delivers nicotine to your body without the negative effects and risks of smoking):   Nicotine gum: Available over-the-counter.   Nicotine lozenges: Available over-the-counter.   Nicotine inhaler: Available by prescription.   Nicotine nasal spray: Available by prescription.   Nicotine skin patches (transdermal): Available by prescription and over-the-counter.   Antidepressant medicine (helps people abstain from smoking, but how this works is unknown):   Bupropion sustained-release (SR) tablets: Available by prescription.   Nicotinic receptor partial agonist (simulates the effect of nicotine in your brain):   Varenicline tartrate tablets: Available by prescription.   Ask your caregiver for advice about which medicines to use and how to use them. Carefully read the information on the package.    Everyone who is trying to quit may benefit from using a medicine. If you are pregnant or trying to become pregnant, nursing an infant, you are under age 18, or you smoke fewer than 10 cigarettes per day, talk to your caregiver before taking any nicotine replacement medicines.   You should stop using a nicotine replacement product and call your caregiver if you experience nausea, dizziness, weakness, vomiting, fast or irregular heartbeat, mouth problems with the lozenge or gum, or redness or swelling of the skin around the patch that does not go away.   Do not use any other product containing nicotine while using a nicotine replacement product.   Talk to your caregiver before using these products if you have diabetes, heart disease, asthma, stomach ulcers, you had a recent heart attack, you have high blood pressure that is not controlled with medicine, a history of irregular heartbeat, or you have been prescribed medicine to help you quit smoking.  5. BE PREPARED FOR RELAPSE OR   DIFFICULT SITUATIONS  Most relapses occur within the first 3 months after quitting. Do not be discouraged if you start smoking again. Remember, most people try several times before they finally quit.   You may have symptoms of withdrawal because your body is used to nicotine. You may crave cigarettes, be irritable, feel very hungry, cough often, get headaches, or have difficulty concentrating.   The withdrawal symptoms are only temporary. They are strongest when you first quit, but they will go away within 10 to 14 days.  Here are some difficult situations to watch for:  Alcohol. Avoid drinking alcohol. Drinking lowers your chances of successfully quitting.   Caffeine. Try to reduce the amount of caffeine you consume. It also lowers your chances of successfully quitting.   Other smokers. Being around smoking can make you want to smoke. Avoid smokers.   Weight gain. Many smokers will gain weight when they quit, usually  less than 10 pounds. Eat a healthy diet and stay active. Do not let weight gain distract you from your main goal, quitting smoking. Some medicines that help you quit smoking may also help delay weight gain. You can always lose the weight gained after you quit.   Bad mood or depression. There are a lot of ways to improve your mood other than smoking.  If you are having problems with any of these situations, talk to your caregiver. SPECIAL SITUATIONS OR CONDITIONS Studies suggest that everyone can quit smoking. Your situation or condition can give you a special reason to quit.  Pregnant women/New mothers: By quitting, you protect your baby's health and your own.   Hospitalized patients: By quitting, you reduce health problems and help healing.   Heart attack patients: By quitting, you reduce your risk of a second heart attack.   Lung, head, and neck cancer patients: By quitting, you reduce your chance of a second cancer.   Parents of children and adolescents: By quitting, you protect your children from illnesses caused by secondhand smoke.  QUESTIONS TO THINK ABOUT Think about the following questions before you try to stop smoking. You may want to talk about your answers with your caregiver.  Why do you want to quit?   If you tried to quit in the past, what helped and what did not?   What will be the most difficult situations for you after you quit? How will you plan to handle them?   Who can help you through the tough times? Your family? Friends? Caregiver?   What pleasures do you get from smoking? What ways can you still get pleasure if you quit?  Here are some questions to ask your caregiver:  How can you help me to be successful at quitting?   What medicine do you think would be best for me and how should I take it?   What should I do if I need more help?   What is smoking withdrawal like? How can I get information on withdrawal?  Quitting takes hard work and a lot of effort,  but you can quit smoking. FOR MORE INFORMATION Smokefree.gov (http://www.smokefree.gov) provides free, accurate, evidence-based information and professional assistance to help support the immediate and long-term needs of people trying to quit smoking. Document Released: 06/23/2001 Document Re-Released: 12/17/2009 [employer] Patient Information 2011 [employer]. 
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
## 2011-02-17 ENCOUNTER — Other Ambulatory Visit: Payer: Self-pay | Admitting: Internal Medicine

## 2011-03-04 ENCOUNTER — Other Ambulatory Visit: Payer: Self-pay | Admitting: Internal Medicine

## 2011-04-15 ENCOUNTER — Other Ambulatory Visit (INDEPENDENT_AMBULATORY_CARE_PROVIDER_SITE_OTHER): Payer: PRIVATE HEALTH INSURANCE

## 2011-04-15 DIAGNOSIS — E785 Hyperlipidemia, unspecified: Secondary | ICD-10-CM

## 2011-04-15 LAB — LIPID PANEL: VLDL: 28.6 mg/dL (ref 0.0–40.0)

## 2011-04-15 LAB — LDL CHOLESTEROL, DIRECT: Direct LDL: 135.1 mg/dL

## 2011-04-22 ENCOUNTER — Ambulatory Visit (INDEPENDENT_AMBULATORY_CARE_PROVIDER_SITE_OTHER): Payer: PRIVATE HEALTH INSURANCE | Admitting: Internal Medicine

## 2011-04-22 ENCOUNTER — Encounter: Payer: Self-pay | Admitting: Internal Medicine

## 2011-04-22 VITALS — BP 120/70 | HR 72 | Temp 98.2°F | Resp 16 | Ht 61.0 in | Wt 164.0 lb

## 2011-04-22 DIAGNOSIS — Z23 Encounter for immunization: Secondary | ICD-10-CM

## 2011-04-22 DIAGNOSIS — E785 Hyperlipidemia, unspecified: Secondary | ICD-10-CM

## 2011-04-22 DIAGNOSIS — M159 Polyosteoarthritis, unspecified: Secondary | ICD-10-CM

## 2011-04-22 DIAGNOSIS — T887XXA Unspecified adverse effect of drug or medicament, initial encounter: Secondary | ICD-10-CM

## 2011-04-22 DIAGNOSIS — F419 Anxiety disorder, unspecified: Secondary | ICD-10-CM

## 2011-04-22 DIAGNOSIS — K219 Gastro-esophageal reflux disease without esophagitis: Secondary | ICD-10-CM

## 2011-04-22 LAB — BASIC METABOLIC PANEL
BUN: 15 mg/dL (ref 6–23)
CO2: 25 mEq/L (ref 19–32)
Chloride: 106 mEq/L (ref 96–112)
Creatinine, Ser: 1 mg/dL (ref 0.4–1.2)
Potassium: 4.7 mEq/L (ref 3.5–5.1)

## 2011-04-22 LAB — CBC WITH DIFFERENTIAL/PLATELET
Basophils Relative: 0 % (ref 0.0–3.0)
Eosinophils Relative: 0.8 % (ref 0.0–5.0)
HCT: 42.6 % (ref 36.0–46.0)
Hemoglobin: 14.2 g/dL (ref 12.0–15.0)
Lymphocytes Relative: 10.4 % — ABNORMAL LOW (ref 12.0–46.0)
Lymphs Abs: 1.7 10*3/uL (ref 0.7–4.0)
Monocytes Relative: 4.5 % (ref 3.0–12.0)
Neutro Abs: 13.8 10*3/uL — ABNORMAL HIGH (ref 1.4–7.7)
RBC: 4.57 Mil/uL (ref 3.87–5.11)
WBC: 16.4 10*3/uL — ABNORMAL HIGH (ref 4.5–10.5)

## 2011-04-22 MED ORDER — TRAMADOL HCL 50 MG PO TABS: 50.0000 mg | ORAL_TABLET | Freq: Three times a day (TID) | ORAL | Status: DC | PRN

## 2011-04-22 MED ORDER — LORAZEPAM 1 MG PO TABS: 1.0000 mg | ORAL_TABLET | Freq: Three times a day (TID) | ORAL | Status: DC

## 2011-04-22 NOTE — Progress Notes (Signed)
  Subjective:    Patient ID: Sonya Franco, female    DOB: 04/17/1941, 70 y.o.   MRN: [id number]  HPI  Has not been walking due to hip pain Follow up lipid labs, GERD and OA reveiwed lipid labs    Review of Systems  Constitutional: Negative for activity change, appetite change and fatigue.  HENT: Negative for ear pain, congestion, neck pain, postnasal drip and sinus pressure.   Eyes: Negative for redness and visual disturbance.  Respiratory: Negative for cough, shortness of breath and wheezing.   Gastrointestinal: Negative for abdominal pain and abdominal distention.  Genitourinary: Negative for dysuria, frequency and menstrual problem.  Musculoskeletal: Positive for back pain, joint swelling, arthralgias and gait problem.  Skin: Negative for rash and wound.  Neurological: Negative for dizziness, weakness and headaches.  Hematological: Negative for adenopathy. Does not bruise/bleed easily.  Psychiatric/Behavioral: Negative for sleep disturbance and decreased concentration.   Past Medical History  Diagnosis Date  . Postmenopausal HRT (hormone replacement therapy)   . Allergy   . Hyperlipidemia   . GERD (gastroesophageal reflux disease)   . Arthritis   . Hemorrhoids    Past Surgical History  Procedure Date  . Cesarean section   . 3 times   . Cesarean section     3 times  . Tubal ligation   . Tonsillectomy     reports that she has been smoking.  She does not have any smokeless tobacco history on file. She reports that she does not drink alcohol or use illicit drugs. family history includes HIV in an unspecified family member; Hypertension in her father; Multiple sclerosis in her mother; and Stroke in her father. Allergies  Allergen Reactions  . Cafgesic   . Diclofenac Other (See Comments)    Tinnitus and upset stomach  . Tramadol         Objective:   Physical Exam  Nursing note and vitals reviewed. Constitutional: She is oriented to person, place, and time.  She appears well-developed and well-nourished. No distress.  HENT:  Head: Normocephalic and atraumatic.  Right Ear: External ear normal.  Left Ear: External ear normal.  Nose: Nose normal.  Mouth/Throat: Oropharynx is clear and moist.  Eyes: Conjunctivae and EOM are normal. Pupils are equal, round, and reactive to light.  Neck: Normal range of motion. Neck supple. No JVD present. No tracheal deviation present. No thyromegaly present.  Cardiovascular: Normal rate, regular rhythm, normal heart sounds and intact distal pulses.   No murmur heard. Pulmonary/Chest: Effort normal and breath sounds normal. She has no wheezes. She exhibits no tenderness.  Abdominal: Soft. Bowel sounds are normal.  Musculoskeletal: Normal range of motion. She exhibits edema and tenderness.  Lymphadenopathy:    She has no cervical adenopathy.  Neurological: She is alert and oriented to person, place, and time. She has normal reflexes. No cranial nerve deficit.  Skin: Skin is warm and dry. She is not diaphoretic.  Psychiatric: She has a normal mood and affect. Her behavior is normal.          Assessment & Plan:  Patient presents today to go over labs  she has multiple labs to evaluate for musculoskeletal pain including Lyme's disease ANA sedimentation rate and rheumatoid evaluations.  All of these labs were negative leading to the conclusion that she has osteoarthritis and bursitis Treatment of her osteoarthritis and bursitis was discussed with patient and allergies to include nonsteroidals ice heat exercise and physical therapy

## 2011-04-22 NOTE — Patient Instructions (Addendum)
The patient is instructed to continue all medications as prescribed. Schedule followup with check out clerk upon leaving the clinic New medication is for pain as needed

## 2011-05-19 ENCOUNTER — Other Ambulatory Visit: Payer: Self-pay | Admitting: Internal Medicine

## 2011-07-01 ENCOUNTER — Ambulatory Visit: Payer: PRIVATE HEALTH INSURANCE | Admitting: Gynecology

## 2011-07-22 ENCOUNTER — Encounter: Payer: Self-pay | Admitting: Gynecology

## 2011-07-22 ENCOUNTER — Telehealth: Payer: Self-pay | Admitting: *Deleted

## 2011-07-22 ENCOUNTER — Ambulatory Visit (INDEPENDENT_AMBULATORY_CARE_PROVIDER_SITE_OTHER): Payer: Medicare Other | Admitting: Gynecology

## 2011-07-22 ENCOUNTER — Other Ambulatory Visit: Payer: Self-pay | Admitting: Gynecology

## 2011-07-22 VITALS — BP 116/78 | Ht 60.25 in | Wt 162.5 lb

## 2011-07-22 DIAGNOSIS — G4700 Insomnia, unspecified: Secondary | ICD-10-CM

## 2011-07-22 DIAGNOSIS — N898 Other specified noninflammatory disorders of vagina: Secondary | ICD-10-CM

## 2011-07-22 DIAGNOSIS — N8111 Cystocele, midline: Secondary | ICD-10-CM

## 2011-07-22 DIAGNOSIS — L293 Anogenital pruritus, unspecified: Secondary | ICD-10-CM

## 2011-07-22 DIAGNOSIS — R8271 Bacteriuria: Secondary | ICD-10-CM

## 2011-07-22 DIAGNOSIS — Z79890 Hormone replacement therapy (postmenopausal): Secondary | ICD-10-CM

## 2011-07-22 DIAGNOSIS — B9689 Other specified bacterial agents as the cause of diseases classified elsewhere: Secondary | ICD-10-CM

## 2011-07-22 DIAGNOSIS — N952 Postmenopausal atrophic vaginitis: Secondary | ICD-10-CM

## 2011-07-22 DIAGNOSIS — N816 Rectocele: Secondary | ICD-10-CM

## 2011-07-22 LAB — URINALYSIS, ROUTINE W REFLEX MICROSCOPIC
Nitrite: NEGATIVE
Protein, ur: NEGATIVE mg/dL
Specific Gravity, Urine: 1.01 (ref 1.005–1.030)
Urobilinogen, UA: 0.2 mg/dL (ref 0.0–1.0)

## 2011-07-22 LAB — URINALYSIS, MICROSCOPIC ONLY: Crystals: NONE SEEN

## 2011-07-22 LAB — WET PREP FOR TRICH, YEAST, CLUE

## 2011-07-22 MED ORDER — CLINDAMYCIN PHOSPHATE 2 % VA CREA: 1.0000 | TOPICAL_CREAM | Freq: Every day | VAGINAL | Status: AC

## 2011-07-22 MED ORDER — CONJ ESTROG-MEDROXYPROGEST ACE 0.3-1.5 MG PO TABS: 1.0000 | ORAL_TABLET | Freq: Every day | ORAL | Status: DC

## 2011-07-22 MED ORDER — ALPRAZOLAM 1 MG PO TABS: 1.0000 mg | ORAL_TABLET | Freq: Every evening | ORAL | Status: DC | PRN

## 2011-07-22 MED ORDER — NYSTATIN-TRIAMCINOLONE 100000-0.1 UNIT/GM-% EX OINT: TOPICAL_OINTMENT | Freq: Two times a day (BID) | CUTANEOUS | Status: DC

## 2011-07-22 MED ORDER — FLUCONAZOLE 150 MG PO TABS: 150.0000 mg | ORAL_TABLET | Freq: Once | ORAL | Status: AC

## 2011-07-22 NOTE — Patient Instructions (Addendum)
Wean from hormones as discussed. Take Diflucan and Cleocin vaginal cream for the itching. If your symptoms persist call. Follow up with Dr. Lovell Sheehan for routine health care. Mail back the stool cards and call to make sure we receive them and that they are negative.  Recommend scheduling mammogram and strongly recommend scheduling colonoscopy.  Stop smoking is always encouraged.

## 2011-07-22 NOTE — Telephone Encounter (Signed)
Pt informed with the below note. 

## 2011-07-22 NOTE — Progress Notes (Signed)
Sonya Franco 1940/11/08 [id number]        71 y.o.  for follow up.  Former patient of Dr. Adalberto Ill who switched due to insurance.  Several issues as noted below.  Past medical history,surgical history, medications, allergies, family history and social history were all reviewed and documented in the EPIC chart. ROS:  Was performed and pertinent positives and negatives are included in the history.  Exam: Sherrilyn Rist chaperone present There were no vitals filed for this visit. General appearance  Normal Skin grossly normal Head/Neck normal with no cervical or supraclavicular adenopathy thyroid normal Lungs  clear Cardiac RR, without RMG Abdominal  soft, nontender, without masses, organomegaly or hernia Breasts  examined lying and sitting without masses, retractions, discharge or axillary adenopathy. Pelvic  Ext/BUS/vagina  Atrophic with mild cystocele and rectocele   Cervix  Atrophic flush with the upper vagina  Uterus Difficult to palpate but grossly normal size, shape and contour, midline and mobile nontender   Adnexa  Without masses or tenderness    Anus and perineum  normal   Rectovaginal  normal sphincter tone without palpated masses or tenderness. Mild rectocele noted   Assessment/Plan:  71 y.o. female for annual exam.    1. HRT. Patient has been on Prempro 0.3/1.5 since menopause. I reviewed the WHI study with increased risk of stroke, heart attack, DVT and breast cancer. The ACOG and NAMS statements her lowest dose for shortest period of time discussed. My recommendation would be to wean from her HRT and see how she does. If she has unacceptable symptoms and she accepts the above risks then she will reinitiate and I refilled her times a year in the event that she does this. She agrees with the plan and will try this. 2. Vaginal/vulvar itching. Wet prep shows BV. Going to cover her with Diflucan 150x1 dose given the itching history and Cleocin vaginal cream x1 week. I also prescribed  Mytrex to have available externally to see if this doesn't help when necessary on a longer term basis. She'll follow up if her symptoms persist. 3. Rectocele/cystocele/atrophic vaginitis. Patient is asymptomatic from this and will continue to follow. 4. Insomnia. She uses Xanax 1 mg when necessary for insomnia from Dr. Adalberto Ill. She says works well for her and I refilled her #90 with 2 refills to have available as needed. 5. Pap smear. She has no history of abnormal Pap smears and has been receiving them on a regular basis her last Pap smear historically was last year and reportedly normal. I reviewed current screening guidelines. She is over the age of 59 and I did not do a Pap smear today and recommended that we stop doing them and she agrees with this. She is to get me copies of her records so we can verify that indeed that she did have them and that they were negative. 6. Mammography. She is due for her mammography now I encouraged her to schedule this and she agrees to do so. SBE monthly reviewed. 7. Colonoscopy. She never had a colonoscopy and refuses to get this. I emphasized and stressed the importance of screening colonoscopy and the issues of early detection of colon cancer. I did give her cards to check her stool for blood and she will mail these back she knows to call after doing so to make sure that we receive them and that they are negative. She also understands that this does not replace colonoscopy I strongly recommended her to schedule this. 8. Bone health. Increase  calcium vitamin D reviewed. She gets bone density studies through Dr. Lovell Sheehan office and actually has an appointment to see him tomorrow and will ask them about when she is due for another bone density. 9. Cigarette smoking. She continues to smoke. I strongly urged her to stop and discussed various strategies. She understands my recommendations and decides to continue to smoke. 10. Health maintenance. She sees Dr. Lovell Sheehan actively  who does her blood work. No blood work was done today and she will follow up with him tomorrow.   Dara Lords MD, 10:39 AM 07/22/2011

## 2011-07-22 NOTE — Telephone Encounter (Signed)
Message copied by Aura Camps on Wed Jul 22, 2011 10:57 AM ------      Message from: Dara Lords      Created: Wed Jul 22, 2011 10:50 AM       Call patient and tell her I also prescribed a vaginal cream to use for the itching that I forgot to tell her about. His one application in the vagina at bedtime times one week. His called Cleocin vaginal cream and should be at her pharmacy

## 2011-07-23 ENCOUNTER — Other Ambulatory Visit: Payer: Self-pay | Admitting: *Deleted

## 2011-07-23 ENCOUNTER — Encounter: Payer: Self-pay | Admitting: Internal Medicine

## 2011-07-23 ENCOUNTER — Other Ambulatory Visit: Payer: Self-pay | Admitting: Gynecology

## 2011-07-23 ENCOUNTER — Ambulatory Visit (INDEPENDENT_AMBULATORY_CARE_PROVIDER_SITE_OTHER): Payer: Medicare Other | Admitting: Internal Medicine

## 2011-07-23 DIAGNOSIS — K219 Gastro-esophageal reflux disease without esophagitis: Secondary | ICD-10-CM

## 2011-07-23 DIAGNOSIS — A499 Bacterial infection, unspecified: Secondary | ICD-10-CM

## 2011-07-23 DIAGNOSIS — B9689 Other specified bacterial agents as the cause of diseases classified elsewhere: Secondary | ICD-10-CM

## 2011-07-23 DIAGNOSIS — D72829 Elevated white blood cell count, unspecified: Secondary | ICD-10-CM

## 2011-07-23 DIAGNOSIS — R300 Dysuria: Secondary | ICD-10-CM

## 2011-07-23 DIAGNOSIS — J309 Allergic rhinitis, unspecified: Secondary | ICD-10-CM

## 2011-07-23 DIAGNOSIS — N760 Acute vaginitis: Secondary | ICD-10-CM

## 2011-07-23 DIAGNOSIS — F17200 Nicotine dependence, unspecified, uncomplicated: Secondary | ICD-10-CM

## 2011-07-23 DIAGNOSIS — N959 Unspecified menopausal and perimenopausal disorder: Secondary | ICD-10-CM

## 2011-07-23 DIAGNOSIS — E785 Hyperlipidemia, unspecified: Secondary | ICD-10-CM

## 2011-07-23 DIAGNOSIS — R8271 Bacteriuria: Secondary | ICD-10-CM

## 2011-07-23 DIAGNOSIS — M5380 Other specified dorsopathies, site unspecified: Secondary | ICD-10-CM

## 2011-07-23 LAB — CBC WITH DIFFERENTIAL/PLATELET
Basophils Absolute: 0 10*3/uL (ref 0.0–0.1)
Eosinophils Absolute: 0 10*3/uL (ref 0.0–0.7)
Hemoglobin: 14.8 g/dL (ref 12.0–15.0)
Lymphocytes Relative: 18.7 % (ref 12.0–46.0)
MCHC: 34.2 g/dL (ref 30.0–36.0)
Monocytes Relative: 5.3 % (ref 3.0–12.0)
Neutrophils Relative %: 75.4 % (ref 43.0–77.0)
Platelets: 320 10*3/uL (ref 150.0–400.0)
RDW: 14.1 % (ref 11.5–14.6)

## 2011-07-23 MED ORDER — SERTRALINE HCL 25 MG PO TABS: 25.0000 mg | ORAL_TABLET | Freq: Every day | ORAL | Status: DC

## 2011-07-23 MED ORDER — ALPRAZOLAM 1 MG PO TABS: 1.0000 mg | ORAL_TABLET | Freq: Every evening | ORAL | Status: AC | PRN

## 2011-07-23 NOTE — Progress Notes (Signed)
Addended by: Rushie Goltz on: 07/23/2011 09:45 AM   Modules accepted: Orders

## 2011-07-23 NOTE — Progress Notes (Signed)
Subjective:    Patient ID: Sonya Franco, female    DOB: 05-06-1941, 71 y.o.   MRN: [id number]  HPI Patient presents today for monitoring of her white cell count to see if she is converted to CLL she has a history of a chronic low-grade leukocytosis and 13-14 K. Range  She also has multiple additional complaints including difficulty sleeping for which she is asking for a refill on her Xanax and also a discussion with her gynecologist about weaning her off her hormone therapy and her chronic anxiety that she has both over the hormone therapy weaning process and over her ability to sleep at night.  She has additional concerns in that she had a vaginosis detected by her gynecologist and has not yet used her antibiotic cream and she may have a urinary tract infection and did not get the urine culture at the time of her office visit yesterday   Review of Systems  Constitutional: Negative for activity change, appetite change and fatigue.  HENT: Negative for ear pain, congestion, neck pain, postnasal drip and sinus pressure.   Eyes: Negative for redness and visual disturbance.  Respiratory: Negative for cough, shortness of breath and wheezing.   Gastrointestinal: Negative for abdominal pain and abdominal distention.  Genitourinary: Negative for dysuria, frequency and menstrual problem.  Musculoskeletal: Negative for myalgias, joint swelling and arthralgias.  Skin: Negative for rash and wound.  Neurological: Negative for dizziness, weakness and headaches.  Hematological: Negative for adenopathy. Does not bruise/bleed easily.  Psychiatric/Behavioral: Negative for sleep disturbance and decreased concentration.   Past Medical History  Diagnosis Date  . Postmenopausal HRT (hormone replacement therapy)   . Allergy   . Hyperlipidemia   . GERD (gastroesophageal reflux disease)   . Arthritis   . Hemorrhoids     History   Social History  . Marital Status: Married    Spouse Name: N/A   Number of Children: N/A  . Years of Education: N/A   Occupational History  . retired    Social History Main Topics  . Smoking status: Current Everyday Smoker -- 0.3 packs/day  . Smokeless tobacco: Not on file  . Alcohol Use: No  . Drug Use: No  . Sexually Active: Not Currently   Other Topics Concern  . Not on file   Social History Narrative  . No narrative on file    Past Surgical History  Procedure Date  . Cesarean section   . Cesarean section     3 times  . Tubal ligation   . Tonsillectomy     Family History  Problem Relation Age of Onset  . HIV    . Multiple sclerosis Mother   . Thyroid disease Mother   . Other Mother     MS  . Stroke Father   . Hypertension Father     Allergies  Allergen Reactions  . Cafgesic   . Diclofenac Other (See Comments)    Tinnitus and upset stomach    Current Outpatient Prescriptions on File Prior to Visit  Medication Sig Dispense Refill  . albuterol (PROVENTIL) 90 MCG/ACT inhaler Inhale 2 puffs into the lungs 2 (two) times daily.  17 g  12  . Calcium Carbonate (CALTRATE 600 PO) Take by mouth.      . cetirizine (ZYRTEC) 10 MG tablet Take 10 mg by mouth daily as needed.        . clindamycin (CLEOCIN T) 1 % external solution Apply topically 2 (two) times daily.  30 mL  3  . clindamycin (CLEOCIN) 2 % vaginal cream Place 1 Applicatorful vaginally at bedtime.  40 g  0  . dextromethorphan-guaiFENesin (MUCINEX DM) 30-600 MG per 12 hr tablet Take 1 tablet by mouth every 12 (twelve) hours.      . Doxylamine Succinate, Sleep, (UNISOM) 25 MG tablet Take 25 mg by mouth at bedtime as needed.        Marland Kitchen estrogen, conjugated,-medroxyprogesterone (PREMPRO) 0.3-1.5 MG per tablet Take 1 tablet by mouth daily.  30 tablet  11  . fluconazole (DIFLUCAN) 150 MG tablet Take 1 tablet (150 mg total) by mouth once.  1 tablet  0  . fluticasone (FLONASE) 50 MCG/ACT nasal spray Place 2 sprays into the nose daily.  16 g  6  . meloxicam (MOBIC) 15 MG tablet Take  15 mg by mouth daily.        . multivitamin (THERAGRAN) per tablet Take 1 tablet by mouth daily.        . mupirocin (BACTROBAN) 2 % ointment APPLY TO SORE 3 TIMES A DAY  22 g  0  . nystatin-triamcinolone ointment (MYCOLOG) Apply topically 2 (two) times daily.  30 g  1  . omeprazole (PRILOSEC) 40 MG capsule TAKE 1 CAPSULE EVERY DAY  30 capsule  6  . PROCTOSOL HC 2.5 % rectal cream APPLY TO RECTUM AS NEEDED FOR HEMORRHOIDS  28.35 g  4  . traMADol (ULTRAM) 50 MG tablet Take 1 tablet (50 mg total) by mouth every 8 (eight) hours as needed for pain.  50 tablet  3  . DISCONTD: meloxicam (MOBIC) 15 MG tablet TAKE 1 TABLET DAILY AS NEEDED  30 tablet  2    BP 128/80  Pulse 76  Temp 98.2 F (36.8 C)  Resp 16  Ht 5' 1.25" (1.556 m)  Wt 160 lb (72.576 kg)  BMI 29.99 kg/m2       Objective:   Physical Exam  Nursing note and vitals reviewed. Constitutional: She is oriented to person, place, and time. She appears well-developed and well-nourished. No distress.  HENT:  Head: Normocephalic and atraumatic.  Right Ear: External ear normal.  Left Ear: External ear normal.  Nose: Nose normal.  Mouth/Throat: Oropharynx is clear and moist.  Eyes: Conjunctivae and EOM are normal. Pupils are equal, round, and reactive to light.  Neck: Normal range of motion. Neck supple. No JVD present. No tracheal deviation present. No thyromegaly present.  Cardiovascular: Normal rate, regular rhythm, normal heart sounds and intact distal pulses.   No murmur heard. Pulmonary/Chest: Effort normal and breath sounds normal. She has no wheezes. She exhibits no tenderness.  Abdominal: Soft. Bowel sounds are normal.  Musculoskeletal: Normal range of motion. She exhibits no edema and no tenderness.  Lymphadenopathy:    She has no cervical adenopathy.  Neurological: She is alert and oriented to person, place, and time. She has normal reflexes. No cranial nerve deficit.  Skin: Skin is warm and dry. She is not diaphoretic.    Psychiatric: She has a normal mood and affect. Her behavior is normal.          Assessment & Plan:  The patient was instructed to take the Cleocin vaginal gel and to use it as directed by her oncologist.  A urine culture will be obtained today and treated as indicated by the culture results.  She is given a refill on her alprazolam to aid her with her anxiety and sleep.  She is started on an SSRI for hormone withdrawal and  for her anxiety depression as we wean her from her hormone therapy  And the primary diagnosis for the visit today was her chronic leukocytosis we will monitor her white count to make sure she has not transformed to CLL if this has occurred we will refer to oncology

## 2011-07-23 NOTE — Patient Instructions (Addendum)
Be sure to go ahead and start using the Cleocin intravaginally And call my office by Friday if you do not hear about the urine culture before the weekend.  Agree with changing the HRT to every other day then stop....   THE ZOLOFT IS TO HELP GET OFF THE HORMONES

## 2011-07-24 ENCOUNTER — Telehealth: Payer: Self-pay | Admitting: Family Medicine

## 2011-07-24 ENCOUNTER — Other Ambulatory Visit: Payer: Self-pay | Admitting: *Deleted

## 2011-07-24 ENCOUNTER — Telehealth: Payer: Self-pay | Admitting: *Deleted

## 2011-07-24 NOTE — Telephone Encounter (Signed)
Pt informed per dr Lovell Sheehan- probably anxiety causes finger tingling and culture not ready yet

## 2011-07-24 NOTE — Telephone Encounter (Signed)
I really doubt that this is an allergic reaction as usually it would be local heat or vaginal discomfort or rash or generalized rash. I would suggest continuing the Cleocin cream. If she develops any other symptoms then will stop it and switch her over to the metronidazole.

## 2011-07-24 NOTE — Telephone Encounter (Signed)
Pt called. Wondering about urine culture. Also - started taking Zoloft yesterday and now her fingers are tingling. Wants to know if that's a reaction to the med or just a coincidence. Please call ASAP.

## 2011-07-24 NOTE — Telephone Encounter (Signed)
Pt informed with the below note. And will call if any other problems.

## 2011-07-24 NOTE — Telephone Encounter (Signed)
Pt was had OV on 07/22/11 and was given for cleocin vaginal cream. Pt had used medication last night and woke up this am with tingling in her left hand. Pt said this has never happened before. Pt said that she has used metronidazole cream before and it worked fine . Please advise

## 2011-07-25 LAB — URINE CULTURE: Colony Count: 75000

## 2011-08-05 ENCOUNTER — Telehealth: Payer: Self-pay | Admitting: Internal Medicine

## 2011-08-05 NOTE — Telephone Encounter (Signed)
Pt informed nothing predominant and hgb is much lower

## 2011-08-05 NOTE — Telephone Encounter (Signed)
Pt is requesting urine results. Pt states her back is still hurting.

## 2011-08-19 ENCOUNTER — Other Ambulatory Visit: Payer: Self-pay | Admitting: *Deleted

## 2011-08-19 MED ORDER — MELOXICAM 15 MG PO TABS: 15.0000 mg | ORAL_TABLET | Freq: Every day | ORAL | Status: DC

## 2011-09-01 ENCOUNTER — Other Ambulatory Visit: Payer: Self-pay | Admitting: *Deleted

## 2011-09-01 MED ORDER — LORAZEPAM 1 MG PO TABS: 1.0000 mg | ORAL_TABLET | Freq: Three times a day (TID) | ORAL | Status: AC | PRN

## 2011-09-02 ENCOUNTER — Other Ambulatory Visit: Payer: Self-pay | Admitting: Gynecology

## 2011-09-03 ENCOUNTER — Other Ambulatory Visit: Payer: Self-pay | Admitting: Internal Medicine

## 2011-09-03 ENCOUNTER — Other Ambulatory Visit: Payer: Self-pay | Admitting: Gynecology

## 2011-09-03 DIAGNOSIS — Z1211 Encounter for screening for malignant neoplasm of colon: Secondary | ICD-10-CM

## 2011-09-03 LAB — POC HEMOCCULT BLD/STL (OFFICE/1-CARD/DIAGNOSTIC): Fecal Occult Blood, POC: NEGATIVE

## 2011-09-17 ENCOUNTER — Ambulatory Visit (INDEPENDENT_AMBULATORY_CARE_PROVIDER_SITE_OTHER): Payer: Medicare Other | Admitting: Internal Medicine

## 2011-09-17 ENCOUNTER — Encounter: Payer: Self-pay | Admitting: Internal Medicine

## 2011-09-17 VITALS — BP 128/80 | HR 76 | Temp 98.2°F | Resp 16 | Ht 61.5 in | Wt 160.0 lb

## 2011-09-17 DIAGNOSIS — M539 Dorsopathy, unspecified: Secondary | ICD-10-CM

## 2011-09-17 DIAGNOSIS — M76899 Other specified enthesopathies of unspecified lower limb, excluding foot: Secondary | ICD-10-CM

## 2011-09-17 DIAGNOSIS — K219 Gastro-esophageal reflux disease without esophagitis: Secondary | ICD-10-CM

## 2011-09-17 DIAGNOSIS — M7071 Other bursitis of hip, right hip: Secondary | ICD-10-CM

## 2011-09-17 DIAGNOSIS — M5380 Other specified dorsopathies, site unspecified: Secondary | ICD-10-CM

## 2011-09-17 DIAGNOSIS — F17200 Nicotine dependence, unspecified, uncomplicated: Secondary | ICD-10-CM

## 2011-09-17 DIAGNOSIS — D7289 Other specified disorders of white blood cells: Secondary | ICD-10-CM

## 2011-09-17 MED ORDER — ETODOLAC 300 MG PO CAPS: 300.0000 mg | ORAL_CAPSULE | Freq: Three times a day (TID) | ORAL | Status: DC

## 2011-09-17 NOTE — Progress Notes (Signed)
Subjective:    Patient ID: Sonya Franco, female    DOB: 1940-07-17, 71 y.o.   MRN: [id number]  HPI Patient is a 71 year old female who is followed for osteoarthritis a history of gastroesophageal reflux and has a chief complaint today of pain in her right head and low back she states that when she rolls over on her right side that she has increased pain and cannot sleep on that side and has to roll to her left.  She has a history of degenerative joint disease or arthritis of the back.  She is currently on blocks and for her arthritis.  She does smoke, is not ready to stop smoking at this time.     Review of Systems  Constitutional: Negative for activity change, appetite change and fatigue.  HENT: Positive for neck pain and neck stiffness. Negative for ear pain, congestion, postnasal drip and sinus pressure.   Eyes: Negative for redness and visual disturbance.  Respiratory: Negative for cough, shortness of breath and wheezing.   Gastrointestinal: Negative for abdominal pain and abdominal distention.  Genitourinary: Negative for dysuria, frequency and menstrual problem.  Musculoskeletal: Positive for myalgias, back pain and arthralgias. Negative for joint swelling.  Skin: Negative for rash and wound.  Neurological: Negative for dizziness, weakness and headaches.  Hematological: Negative for adenopathy. Does not bruise/bleed easily.  Psychiatric/Behavioral: Negative for sleep disturbance and decreased concentration.   Past Medical History  Diagnosis Date  . Postmenopausal HRT (hormone replacement therapy)   . Allergy   . Hyperlipidemia   . GERD (gastroesophageal reflux disease)   . Arthritis   . Hemorrhoids     History   Social History  . Marital Status: Married    Spouse Name: N/A    Number of Children: N/A  . Years of Education: N/A   Occupational History  . retired    Social History Main Topics  . Smoking status: Current Everyday Smoker -- 0.3 packs/day  .  Smokeless tobacco: Not on file  . Alcohol Use: No  . Drug Use: No  . Sexually Active: Not Currently   Other Topics Concern  . Not on file   Social History Narrative  . No narrative on file    Past Surgical History  Procedure Date  . Cesarean section   . Cesarean section     3 times  . Tubal ligation   . Tonsillectomy     Family History  Problem Relation Age of Onset  . HIV    . Multiple sclerosis Mother   . Thyroid disease Mother   . Other Mother     MS  . Stroke Father   . Hypertension Father     Allergies  Allergen Reactions  . Cafgesic   . Diclofenac Other (See Comments)    Tinnitus and upset stomach    Current Outpatient Prescriptions on File Prior to Visit  Medication Sig Dispense Refill  . albuterol (PROVENTIL) 90 MCG/ACT inhaler Inhale 2 puffs into the lungs 2 (two) times daily.  17 g  12  . Calcium Carbonate (CALTRATE 600 PO) Take by mouth.      . cetirizine (ZYRTEC) 10 MG tablet Take 10 mg by mouth daily as needed.        Marland Kitchen dextromethorphan-guaiFENesin (MUCINEX DM) 30-600 MG per 12 hr tablet Take 1 tablet by mouth every 12 (twelve) hours.      . Doxylamine Succinate, Sleep, (UNISOM) 25 MG tablet Take 25 mg by mouth at bedtime as needed.        Marland Kitchen  estrogen, conjugated,-medroxyprogesterone (PREMPRO) 0.3-1.5 MG per tablet Take 1 tablet by mouth every other day.      . fluticasone (FLONASE) 50 MCG/ACT nasal spray Place 2 sprays into the nose daily.  16 g  6  . multivitamin (THERAGRAN) per tablet Take 1 tablet by mouth daily.        Marland Kitchen nystatin-triamcinolone ointment (MYCOLOG) Apply topically 2 (two) times daily.  30 g  1  . PROCTOSOL HC 2.5 % rectal cream APPLY TO RECTUM AS NEEDED FOR HEMORRHOIDS  28.35 g  4  . sertraline (ZOLOFT) 25 MG tablet Take 1 tablet (25 mg total) by mouth daily.  30 tablet  4  . omeprazole (PRILOSEC) 40 MG capsule TAKE 1 CAPSULE EVERY DAY  30 capsule  5    BP 128/80  Pulse 76  Temp 98.2 F (36.8 C)  Resp 16  Ht 5' 1.5" (1.562 m)   Wt 160 lb (72.576 kg)  BMI 29.74 kg/m2       Objective:   Physical Exam  Nursing note and vitals reviewed. Constitutional: She is oriented to person, place, and time. She appears well-developed and well-nourished. No distress.  HENT:  Head: Normocephalic and atraumatic.  Right Ear: External ear normal.  Left Ear: External ear normal.  Nose: Nose normal.  Mouth/Throat: Oropharynx is clear and moist.  Eyes: Conjunctivae and EOM are normal. Pupils are equal, round, and reactive to light.  Neck: Normal range of motion. Neck supple. No JVD present. No tracheal deviation present. No thyromegaly present.  Cardiovascular: Normal rate, regular rhythm, normal heart sounds and intact distal pulses.   No murmur heard. Pulmonary/Chest: Effort normal and breath sounds normal. She has no wheezes. She exhibits no tenderness.  Abdominal: Soft. Bowel sounds are normal.  Musculoskeletal: She exhibits tenderness.       Chest tenderness in the trapezius muscles bilaterally with muscle spasm evident  Lymphadenopathy:    She has no cervical adenopathy.  Neurological: She is alert and oriented to person, place, and time. She has normal reflexes. No cranial nerve deficit.  Skin: Skin is warm and dry. She is not diaphoretic.  Psychiatric: She has a normal mood and affect. Her behavior is normal.          Assessment & Plan:  I believe this patient has pain in her shoulder from spondylosis and nerve compression in the neck.  There may also be a component of bursitis but I cannot elicit pain by manipulating the shoulder.  We have discussed the use of ice and heat on the neck for muscle relaxation the use of a non-steroidal  Lodine twice daily as scheduled for continuing the Prilosec for protection of her GERD.  We gave her neck exercises to review and use.  We discussed her tobacco abuse but she is not ready to quit at this time her response is "one day" and my grandchildren are trying to get me to  quit. Her COPD is stable at this point. She continues hormone therapy at her request we'll estimate there is a high risk due to her age and smoking.  She does have bursitis in her hip

## 2011-09-17 NOTE — Patient Instructions (Addendum)
We are going to try the etodolac 2-3 times a day for the hip and back pain if not successful then continue the medication but if you still have problems laying on her right side didn't contact my office for a hip injection prior to her next visit.

## 2011-09-18 ENCOUNTER — Telehealth: Payer: Self-pay | Admitting: Family Medicine

## 2011-09-18 NOTE — Telephone Encounter (Signed)
etodolac (LODINE) 300 MG capsule - pt thinks this medicine is giving her diarrhea..needs to know what to do.

## 2011-09-18 NOTE — Telephone Encounter (Signed)
Pt instructed to stop lodine and if sx stops start back and see if sx return and call us if they do

## 2011-09-22 ENCOUNTER — Telehealth: Payer: Self-pay | Admitting: *Deleted

## 2011-09-22 MED ORDER — LORAZEPAM 1 MG PO TABS: 1.0000 mg | ORAL_TABLET | Freq: Two times a day (BID) | ORAL | Status: AC | PRN

## 2011-09-22 NOTE — Telephone Encounter (Signed)
Pt is asking for an early refill of Lorazepam 1mg .  30 day supply was given 2/19.  Counted twice and inventory is correct.

## 2011-10-04 ENCOUNTER — Other Ambulatory Visit: Payer: Self-pay | Admitting: Internal Medicine

## 2011-11-27 ENCOUNTER — Other Ambulatory Visit: Payer: Self-pay | Admitting: *Deleted

## 2011-11-27 MED ORDER — LORAZEPAM 1 MG PO TABS: 1.0000 mg | ORAL_TABLET | Freq: Two times a day (BID) | ORAL | Status: AC | PRN

## 2011-12-18 ENCOUNTER — Other Ambulatory Visit: Payer: Self-pay | Admitting: *Deleted

## 2011-12-18 ENCOUNTER — Ambulatory Visit: Payer: Medicare Other | Admitting: Internal Medicine

## 2011-12-18 MED ORDER — ALPRAZOLAM 1 MG PO TABS: 1.0000 mg | ORAL_TABLET | Freq: Every evening | ORAL | Status: AC | PRN

## 2011-12-26 ENCOUNTER — Other Ambulatory Visit: Payer: Self-pay | Admitting: Internal Medicine

## 2011-12-29 ENCOUNTER — Ambulatory Visit (INDEPENDENT_AMBULATORY_CARE_PROVIDER_SITE_OTHER): Payer: Medicare Other | Admitting: Internal Medicine

## 2011-12-29 ENCOUNTER — Encounter: Payer: Self-pay | Admitting: Internal Medicine

## 2011-12-29 VITALS — BP 122/80 | HR 88 | Temp 98.7°F | Wt 160.0 lb

## 2011-12-29 DIAGNOSIS — M1990 Unspecified osteoarthritis, unspecified site: Secondary | ICD-10-CM

## 2011-12-29 DIAGNOSIS — F17200 Nicotine dependence, unspecified, uncomplicated: Secondary | ICD-10-CM

## 2011-12-29 DIAGNOSIS — M130 Polyarthritis, unspecified: Secondary | ICD-10-CM

## 2011-12-29 DIAGNOSIS — M5380 Other specified dorsopathies, site unspecified: Secondary | ICD-10-CM

## 2011-12-29 MED ORDER — METHYLPREDNISOLONE (PAK) 4 MG PO TABS: ORAL_TABLET | ORAL | Status: AC

## 2011-12-29 MED ORDER — TRAMADOL HCL 50 MG PO TABS: 50.0000 mg | ORAL_TABLET | Freq: Three times a day (TID) | ORAL | Status: DC | PRN

## 2011-12-29 MED ORDER — CELECOXIB 200 MG PO CAPS: 200.0000 mg | ORAL_CAPSULE | Freq: Two times a day (BID) | ORAL | Status: AC

## 2011-12-29 NOTE — Progress Notes (Signed)
Subjective:    Patient ID: Sonya Franco, female    DOB: Apr 17, 1941, 71 y.o.   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  She's been on several courses of different nonsteroidals none of which have successfully controlled her pain she was given Ultram but she didn't take it because she feared that it might be addictive.  She also has a history of chronic leukocytosis of unknown etiology and is in the past refused more extensive workup.   Review of Systems  Constitutional: Negative for activity change, appetite change and fatigue.  HENT: Negative for ear pain, congestion, neck pain, postnasal drip and sinus pressure.   Eyes: Negative for redness and visual disturbance.  Respiratory: Negative for cough, shortness of breath and wheezing.   Gastrointestinal: Negative for abdominal pain and abdominal distention.  Genitourinary: Negative for dysuria, frequency and menstrual problem.  Musculoskeletal: Negative for myalgias, joint swelling and arthralgias.  Skin: Negative for rash and wound.  Neurological: Negative for dizziness, weakness and headaches.  Hematological: Negative for adenopathy. Does not bruise/bleed easily.  Psychiatric/Behavioral: Negative for disturbed wake/sleep cycle and decreased concentration.   Past Medical History  Diagnosis Date  . Postmenopausal HRT (hormone replacement therapy)   . Allergy   . Hyperlipidemia   . GERD (gastroesophageal reflux disease)   . Arthritis   . Hemorrhoids     History   Social History  . Marital Status: Married    Spouse Name: N/A    Number of Children: N/A  . Years of Education: N/A   Occupational History  . retired    Social History Main Topics  . Smoking status: Current Everyday  Smoker -- 0.3 packs/day  . Smokeless tobacco: Not on file  . Alcohol Use: No  . Drug Use: No  . Sexually Active: Not Currently   Other Topics Concern  . Not on file   Social History Narrative  . No narrative on file    Past Surgical History  Procedure Date  . Cesarean section   . Cesarean section     3 times  . Tubal ligation   . Tonsillectomy     Family History  Problem Relation Age of Onset  . HIV    . Multiple sclerosis Mother   . Thyroid disease Mother   . Other Mother     MS  . Stroke Father   . Hypertension Father     Allergies  Allergen Reactions  . Apap-Salicyl-Phenyltolox-Caff   . Diclofenac Other (See Comments)    Tinnitus and upset stomach    Current Outpatient Prescriptions on File Prior to Visit  Medication Sig Dispense Refill  . ALPRAZolam (XANAX) 1 MG tablet Take 1 tablet (1 mg total) by mouth at bedtime as needed for sleep.  90 tablet  1  . Calcium Carbonate (CALTRATE 600 PO) Take by mouth.      . cetirizine (ZYRTEC) 10 MG tablet Take 10 mg by mouth daily as needed.        Marland Kitchen dextromethorphan-guaiFENesin (MUCINEX DM) 30-600 MG per 12 hr tablet Take 1 tablet by mouth every 12 (twelve) hours.      . Doxylamine Succinate, Sleep, (UNISOM) 25 MG tablet Take 25 mg by mouth at bedtime as needed.        Marland Kitchen estrogen,  conjugated,-medroxyprogesterone (PREMPRO) 0.3-1.5 MG per tablet Take 1 tablet by mouth every other day.      . fluticasone (FLONASE) 50 MCG/ACT nasal spray Place 2 sprays into the nose daily.  16 g  6  . multivitamin (THERAGRAN) per tablet Take 1 tablet by mouth daily.        Marland Kitchen nystatin-triamcinolone ointment (MYCOLOG) Apply topically 2 (two) times daily.  30 g  1  . omeprazole (PRILOSEC) 40 MG capsule TAKE 1 CAPSULE EVERY DAY  30 capsule  5  . PROAIR HFA 108 (90 BASE) MCG/ACT inhaler INHALE 2 PUFFS INTO THE LUNGS 2 (TWO) TIMES DAILY.  8.5 g  6  . PROCTOSOL HC 2.5 % rectal cream APPLY TO RECTUM AS NEEDED FOR HEMORRHOIDS  28.35 g  4  . DISCONTD:  albuterol (PROVENTIL) 90 MCG/ACT inhaler Inhale 2 puffs into the lungs 2 (two) times daily.  17 g  12    BP 122/80  Pulse 88  Temp 98.7 F (37.1 C) (Oral)  Wt 160 lb (72.576 kg)       Objective:   Physical Exam  Nursing note and vitals reviewed. Constitutional: She is oriented to person, place, and time. She appears well-developed and well-nourished. No distress.  HENT:  Head: Normocephalic and atraumatic.  Right Ear: External ear normal.  Left Ear: External ear normal.  Nose: Nose normal.  Mouth/Throat: Oropharynx is clear and moist.  Eyes: Conjunctivae and EOM are normal. Pupils are equal, round, and reactive to light.  Neck: Normal range of motion. Neck supple. No JVD present. No tracheal deviation present. No thyromegaly present.  Cardiovascular: Normal rate, regular rhythm, normal heart sounds and intact distal pulses.   No murmur heard. Pulmonary/Chest: Effort normal and breath sounds normal. She has no wheezes. She exhibits no tenderness.  Abdominal: Soft. Bowel sounds are normal.  Musculoskeletal: She exhibits tenderness. She exhibits no edema.  Lymphadenopathy:    She has no cervical adenopathy.  Neurological: She is alert and oriented to person, place, and time. She displays abnormal reflex. No cranial nerve deficit. She exhibits abnormal muscle tone.  Skin: Skin is warm and dry. She is not diaphoretic.  Psychiatric: She has a normal mood and affect. Her behavior is normal.          Assessment & Plan:  The patient as polyarticular arthritis we evaluated this almost one year ago to date sedimentation rate was completely normal.  We will further evaluate this with repeat sedimentation rate rheumatoid factor and ANA although I do not think this is a viral mean phenomenon certainly she is at age risk for polymyalgia rheumatica.  We'll give her a prednisone burst and taper to observe how she will respond to that and we will change her anti-inflammatory to Celebrex  200 mg by mouth twice a day. Discussed this in detail

## 2011-12-29 NOTE — Patient Instructions (Addendum)
You may use the tramadol as needed for pain up to 3 pills a day in addition to your anti-inflammatory the celebrex twice a day  Back Exercises Back exercises help treat and prevent back injuries. The goal of back exercises is to increase the strength of your abdominal and back muscles and the flexibility of your back. These exercises should be started when you no longer have back pain. Back exercises include:  Pelvic Tilt. Lie on your back with your knees bent. Tilt your pelvis until the lower part of your back is against the floor. Hold this position 5 to 10 sec and repeat 5 to 10 times.   Knee to Chest. Pull first 1 knee up against your chest and hold for 20 to 30 seconds, repeat this with the other knee, and then both knees. This may be done with the other leg straight or bent, whichever feels better.   Sit-Ups or Curl-Ups. Bend your knees 90 degrees. Start with tilting your pelvis, and do a partial, slow sit-up, lifting your trunk only 30 to 45 degrees off the floor. Take at least 2 to 3 seconds for each sit-up. Do not do sit-ups with your knees out straight. If partial sit-ups are difficult, simply do the above but with only tightening your abdominal muscles and holding it as directed.   Hip-Lift. Lie on your back with your knees flexed 90 degrees. Push down with your feet and shoulders as you raise your hips a couple inches off the floor; hold for 10 seconds, repeat 5 to 10 times.   Back arches. Lie on your stomach, propping yourself up on bent elbows. Slowly press on your hands, causing an arch in your low back. Repeat 3 to 5 times. Any initial stiffness and discomfort should lessen with repetition over time.   Shoulder-Lifts. Lie face down with arms beside your body. Keep hips and torso pressed to floor as you slowly lift your head and shoulders off the floor.  Do not overdo your exercises, especially in the beginning. Exercises may cause you some mild back discomfort which lasts for a few  minutes; however, if the pain is more severe, or lasts for more than 15 minutes, do not continue exercises until you see your caregiver. Improvement with exercise therapy for back problems is slow.  See your caregivers for assistance with developing a proper back exercise program. Document Released: 08/06/2004 Document Revised: 06/18/2011 Document Reviewed: 06/29/2005 [facility] Patient Information 2012 [place], [place].

## 2011-12-30 LAB — RHEUMATOID FACTOR: Rhuematoid fact SerPl-aCnc: <10 IU/mL (ref <=14)

## 2012-01-15 ENCOUNTER — Telehealth: Payer: Self-pay | Admitting: Internal Medicine

## 2012-01-15 NOTE — Telephone Encounter (Signed)
Caller: Jacquelyn/Patient; Phone Number: [phone]; Message from caller: Pt states she stopped Celebrex because it was causing her to have gas, feel tired, and have diarrhea.  She is not having any of these s/s since stopping.  Pt wants to know if she can restart Meloxicam and if so can she take it twice per day.  She has 2 refills left but that is written for once a day.

## 2012-01-18 NOTE — Telephone Encounter (Signed)
Spoke with patient and she is aware that Mobic can only be take once daily.

## 2012-01-18 NOTE — Telephone Encounter (Signed)
If it is the 15 mg meloxicam it is taken only once daily

## 2012-03-30 IMAGING — US US RENAL
1 series · 14 of 25 positions shown · non-contrast
Comparison: Renal ultrasound 12/20/2009.

CLINICAL DATA: History of kidney cysts.

RENAL/URINARY TRACT ULTRASOUND COMPLETE

[Series 1: us renal · 0.28mm/px · 14 of 31 slices shown]
[im 1/31]
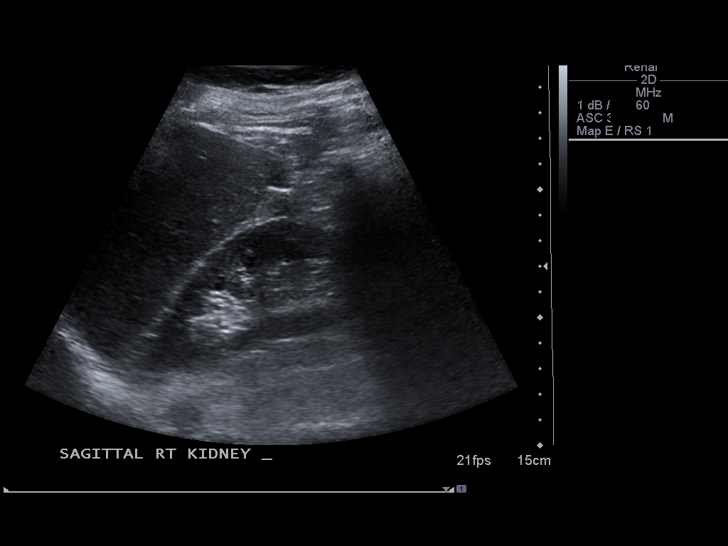
[im 3/31]
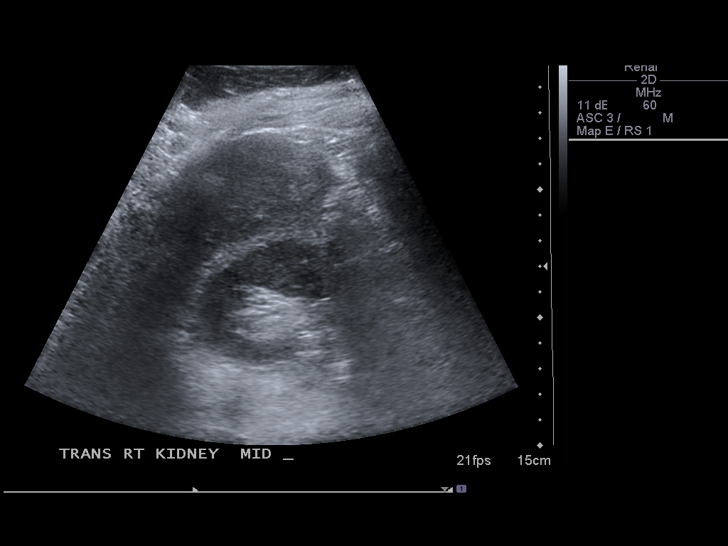
[im 6/31]
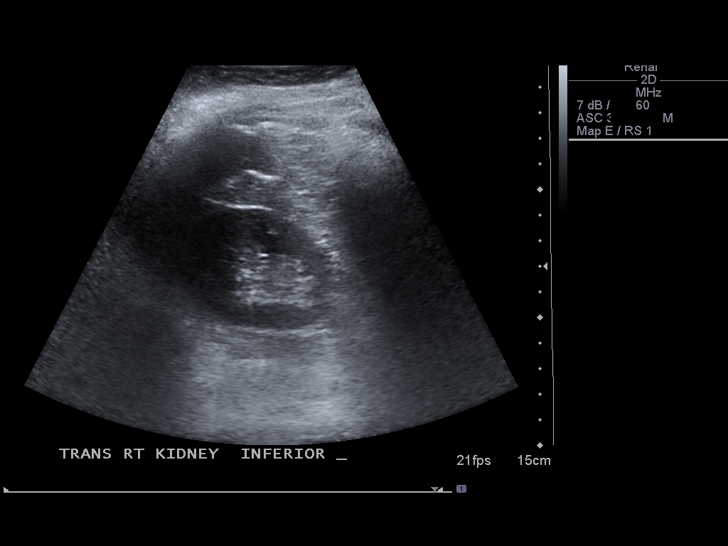
[im 8/31]
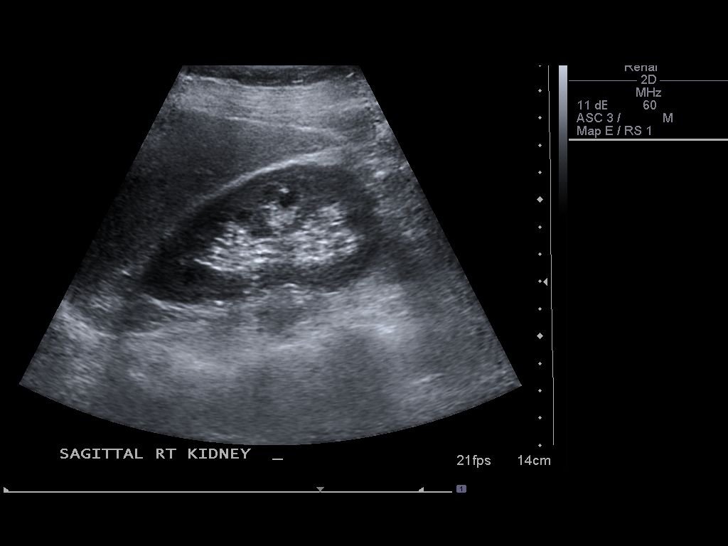
[im 11/31]
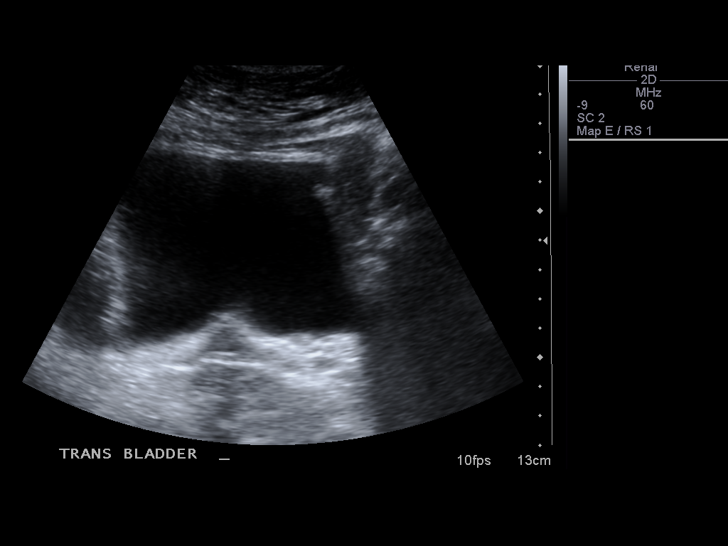
[im 12/31]
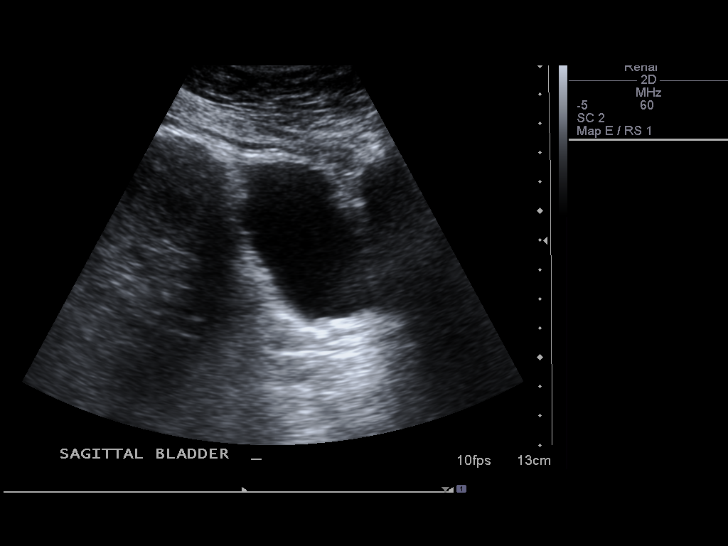
[im 14/31]
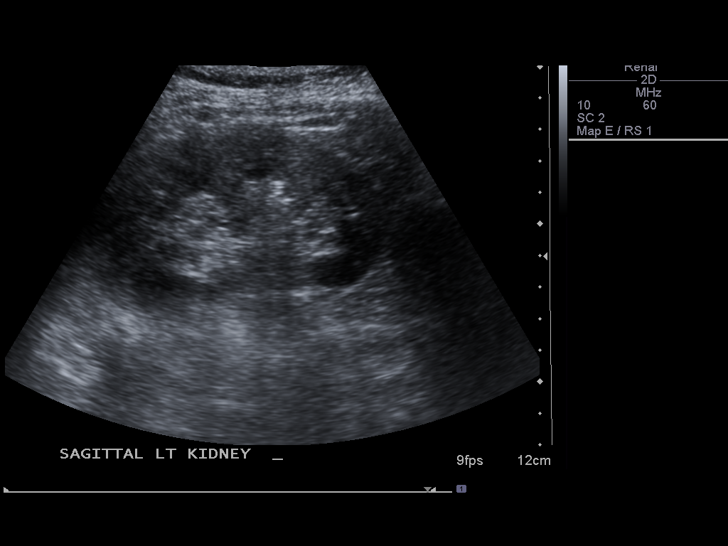
[im 17/31]
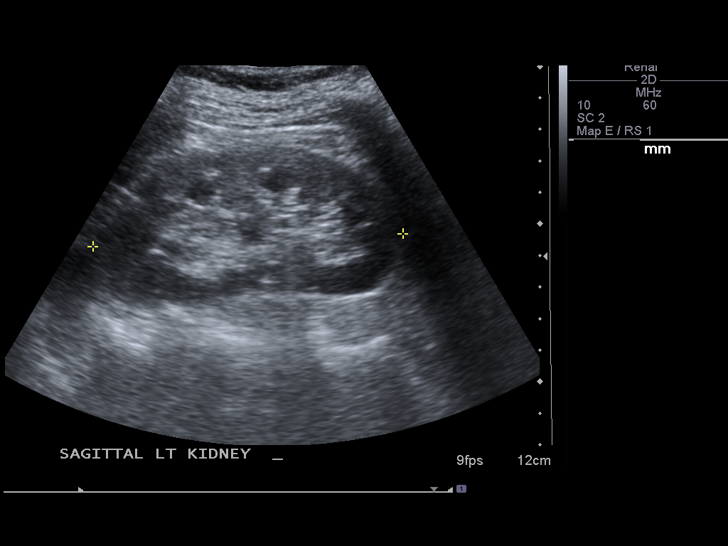
[im 19/31]
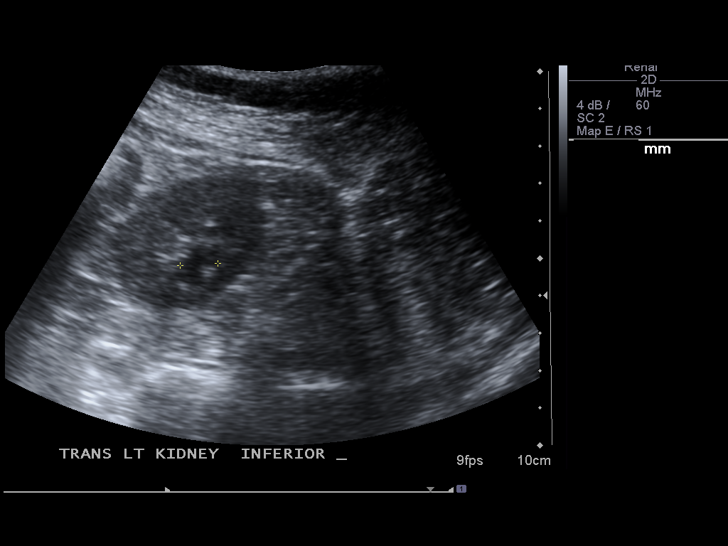
[im 21/31]
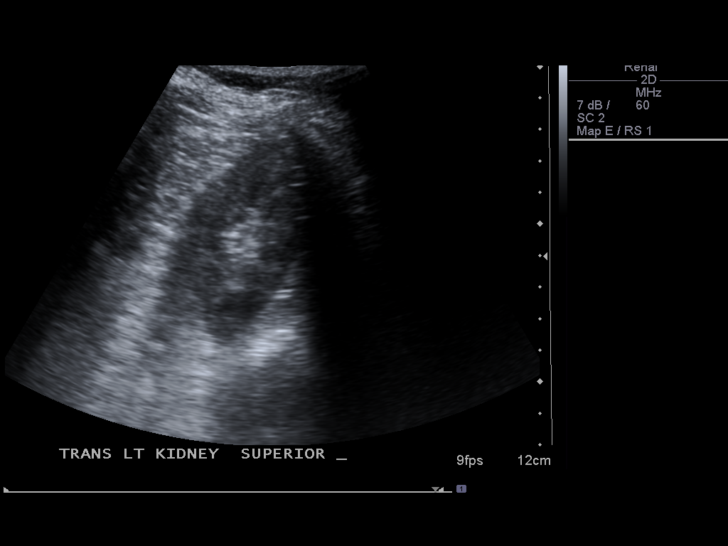
[im 23/31]
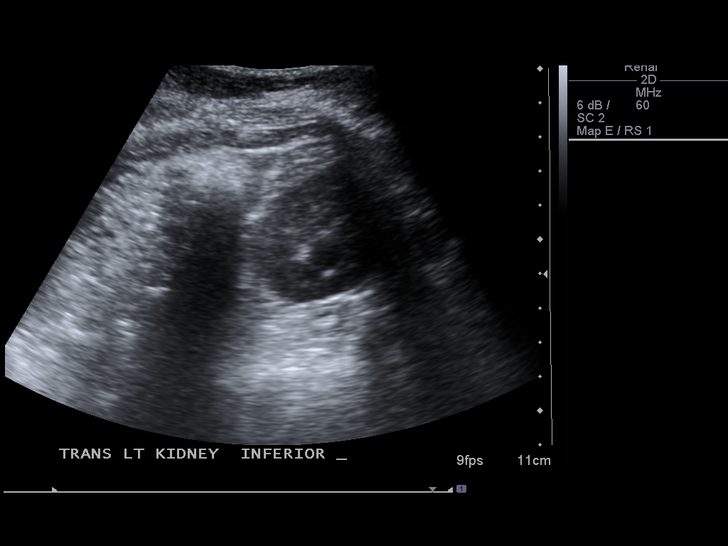
[im 26/31]
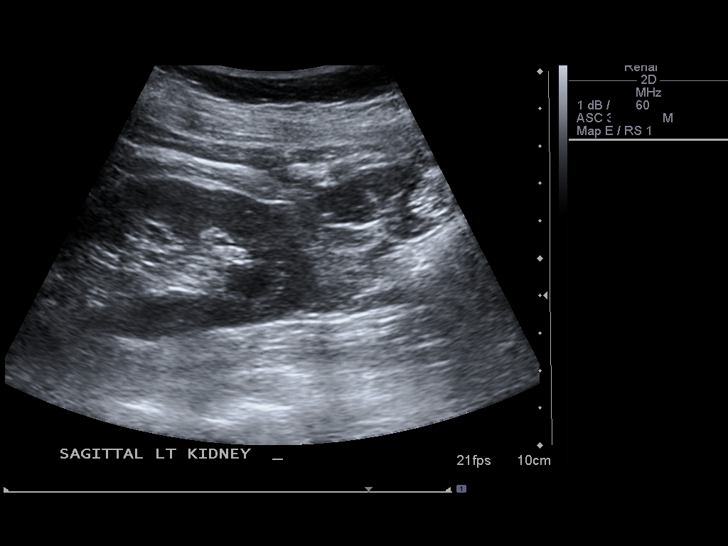
[im 28/31]
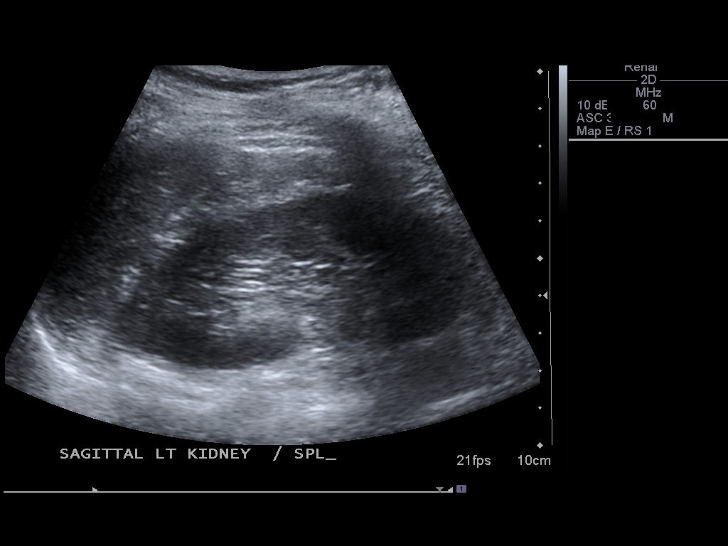
[im 31/31]
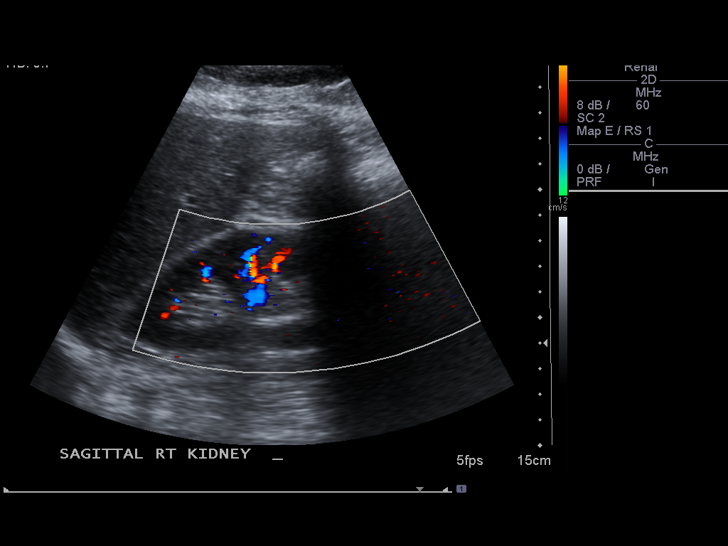

[14 of 25 positions shown; findings below may reference images not displayed]

FINDINGS: Right Kidney:  Normal in size (9.9 cm) and parenchymal
echogenicity.  No evidence of hydronephrosis or mass.

Left Kidney:  Normal in size and parenchymal echogenicity.  No
evidence of hydronephrosis. A 1.2 x 0.9 x 1.0 cm rounded hypoechoic
area within the lower pole cortex contains some internal echoes,
and appears unchanged compared to the renal ultrasound of 9333.

Bladder:  Appears normal for degree of bladder distention.
IMPRESSION: 1.2 cm hypoechoic area in the lower pole left kidney appears
similar compared to ultrasound December 2009.  Its appearance suggests
a complex cyst.  If the patient has hematuria, as described on
recent renal ultrasound December 2009, then further evaluation with
MRI of the abdomen with without contrast could  be considered.

## 2012-03-31 ENCOUNTER — Ambulatory Visit (INDEPENDENT_AMBULATORY_CARE_PROVIDER_SITE_OTHER): Payer: Medicare Other | Admitting: Internal Medicine

## 2012-03-31 ENCOUNTER — Encounter: Payer: Self-pay | Admitting: Internal Medicine

## 2012-03-31 VITALS — BP 130/76 | HR 72 | Temp 98.2°F | Resp 16 | Ht 61.5 in | Wt 160.0 lb

## 2012-03-31 DIAGNOSIS — F329 Major depressive disorder, single episode, unspecified: Secondary | ICD-10-CM

## 2012-03-31 DIAGNOSIS — Z23 Encounter for immunization: Secondary | ICD-10-CM

## 2012-03-31 DIAGNOSIS — D7289 Other specified disorders of white blood cells: Secondary | ICD-10-CM

## 2012-03-31 DIAGNOSIS — M130 Polyarthritis, unspecified: Secondary | ICD-10-CM

## 2012-03-31 DIAGNOSIS — K219 Gastro-esophageal reflux disease without esophagitis: Secondary | ICD-10-CM

## 2012-03-31 DIAGNOSIS — F341 Dysthymic disorder: Secondary | ICD-10-CM

## 2012-03-31 DIAGNOSIS — T887XXA Unspecified adverse effect of drug or medicament, initial encounter: Secondary | ICD-10-CM

## 2012-03-31 DIAGNOSIS — E785 Hyperlipidemia, unspecified: Secondary | ICD-10-CM

## 2012-03-31 LAB — BASIC METABOLIC PANEL
Chloride: 101 mEq/L (ref 96–112)
Potassium: 5.3 mEq/L — ABNORMAL HIGH (ref 3.5–5.1)
Sodium: 134 mEq/L — ABNORMAL LOW (ref 135–145)

## 2012-03-31 LAB — HEPATIC FUNCTION PANEL
Albumin: 4.3 g/dL (ref 3.5–5.2)
Total Protein: 7.8 g/dL (ref 6.0–8.3)

## 2012-03-31 LAB — CBC WITH DIFFERENTIAL/PLATELET
Basophils Absolute: 0 10*3/uL (ref 0.0–0.1)
Eosinophils Absolute: 0.1 10*3/uL (ref 0.0–0.7)
Lymphocytes Relative: 22.4 % (ref 12.0–46.0)
Lymphs Abs: 2.5 10*3/uL (ref 0.7–4.0)
MCHC: 32.6 g/dL (ref 30.0–36.0)
Monocytes Relative: 5.7 % (ref 3.0–12.0)
Platelets: 359 10*3/uL (ref 150.0–400.0)
RDW: 14.1 % (ref 11.5–14.6)

## 2012-03-31 LAB — LIPID PANEL
Cholesterol: 216 mg/dL — ABNORMAL HIGH (ref 0–200)
Total CHOL/HDL Ratio: 4
Triglycerides: 113 mg/dL (ref 0.0–149.0)
VLDL: 22.6 mg/dL (ref 0.0–40.0)

## 2012-03-31 LAB — TSH: TSH: 1.52 u[IU]/mL (ref 0.35–5.50)

## 2012-03-31 LAB — LDL CHOLESTEROL, DIRECT: Direct LDL: 135.1 mg/dL

## 2012-03-31 MED ORDER — ALPRAZOLAM 1 MG PO TABS: 1.0000 mg | ORAL_TABLET | Freq: Four times a day (QID) | ORAL | Status: DC | PRN

## 2012-03-31 MED ORDER — LORAZEPAM 1 MG PO TABS: 1.0000 mg | ORAL_TABLET | Freq: Three times a day (TID) | ORAL | Status: DC

## 2012-03-31 MED ORDER — LORAZEPAM 1 MG PO TABS: 1.0000 mg | ORAL_TABLET | Freq: Four times a day (QID) | ORAL | Status: DC | PRN

## 2012-03-31 NOTE — Addendum Note (Signed)
Addended by: Willy Eddy on: 03/31/2012 10:46 AM   Modules accepted: Orders

## 2012-03-31 NOTE — Progress Notes (Signed)
Subjective:    Patient ID: Sonya Franco, female    DOB: 1940-11-03, 71 y.o.   MRN: [id number]  HPI Patient is a 71 year old female is followed for hyperlipidemia gastroesophageal reflux a history of chronic leukocytosis and a history of polyarticular arthritis. She was not able to tolerate Celebrex for polyarticular arthritis she is taking meloxicam 15 mg daily and Tylenol as needed.  Her breathing is been stable on Provera twice daily She continues to smoke!!!!!     Review of Systems  Constitutional: Negative for activity change, appetite change and fatigue.  HENT: Positive for congestion, sneezing and postnasal drip. Negative for ear pain, neck pain and sinus pressure.   Eyes: Negative for redness and visual disturbance.  Respiratory: Positive for shortness of breath and stridor. Negative for cough and wheezing.   Gastrointestinal: Negative for abdominal pain and abdominal distention.  Genitourinary: Negative for dysuria, frequency and menstrual problem.  Musculoskeletal: Negative for myalgias, joint swelling and arthralgias.  Skin: Negative for rash and wound.  Neurological: Positive for weakness. Negative for dizziness and headaches.  Hematological: Negative for adenopathy. Does not bruise/bleed easily.  Psychiatric/Behavioral: Positive for dysphoric mood. Negative for disturbed wake/sleep cycle and decreased concentration. The patient is nervous/anxious.    Past Medical History  Diagnosis Date  . Postmenopausal HRT (hormone replacement therapy)   . Allergy   . Hyperlipidemia   . GERD (gastroesophageal reflux disease)   . Arthritis   . Hemorrhoids     History   Social History  . Marital Status: Married    Spouse Name: N/A    Number of Children: N/A  . Years of Education: N/A   Occupational History  . retired    Social History Main Topics  . Smoking status: Current Every Day Smoker -- 0.3 packs/day  . Smokeless tobacco: Not on file  . Alcohol Use: No  .  Drug Use: No  . Sexually Active: Not Currently   Other Topics Concern  . Not on file   Social History Narrative  . No narrative on file    Past Surgical History  Procedure Date  . Cesarean section   . Cesarean section     3 times  . Tubal ligation   . Tonsillectomy     Family History  Problem Relation Age of Onset  . HIV    . Multiple sclerosis Mother   . Thyroid disease Mother   . Other Mother     MS  . Stroke Father   . Hypertension Father     Allergies  Allergen Reactions  . Apap-Salicyl-Phenyltolox-Caff   . Diclofenac Other (See Comments)    Tinnitus and upset stomach    Current Outpatient Prescriptions on File Prior to Visit  Medication Sig Dispense Refill  . Calcium Carbonate (CALTRATE 600 PO) Take by mouth.      . cetirizine (ZYRTEC) 10 MG tablet Take 10 mg by mouth daily as needed.        Marland Kitchen dextromethorphan-guaiFENesin (MUCINEX DM) 30-600 MG per 12 hr tablet Take 1 tablet by mouth every 12 (twelve) hours.      . Doxylamine Succinate, Sleep, (UNISOM) 25 MG tablet Take 25 mg by mouth at bedtime as needed.        Marland Kitchen estrogen, conjugated,-medroxyprogesterone (PREMPRO) 0.3-1.5 MG per tablet Take 1 tablet by mouth every other day.      . fluticasone (FLONASE) 50 MCG/ACT nasal spray Place 2 sprays into the nose daily.  16 g  6  . multivitamin (THERAGRAN)  per tablet Take 1 tablet by mouth daily.        Marland Kitchen omeprazole (PRILOSEC) 40 MG capsule TAKE 1 CAPSULE EVERY DAY  30 capsule  5  . PROAIR HFA 108 (90 BASE) MCG/ACT inhaler INHALE 2 PUFFS INTO THE LUNGS 2 (TWO) TIMES DAILY.  8.5 g  6  . PROCTOSOL HC 2.5 % rectal cream APPLY TO RECTUM AS NEEDED FOR HEMORRHOIDS  28.35 g  4  . DISCONTD: albuterol (PROVENTIL) 90 MCG/ACT inhaler Inhale 2 puffs into the lungs 2 (two) times daily.  17 g  12    BP 130/76  Pulse 72  Temp 98.2 F (36.8 C)  Resp 16  Ht 5' 1.5" (1.562 m)  Wt 160 lb (72.576 kg)  BMI 29.74 kg/m2        Objective:   Physical Exam  Nursing note and  vitals reviewed. Constitutional: She is oriented to person, place, and time. She appears well-developed and well-nourished. No distress.  HENT:  Head: Normocephalic and atraumatic.  Right Ear: External ear normal.  Left Ear: External ear normal.  Nose: Nose normal.  Mouth/Throat: Oropharynx is clear and moist.  Eyes: Conjunctivae normal and EOM are normal. Pupils are equal, round, and reactive to light.  Neck: Normal range of motion. Neck supple. No JVD present. No tracheal deviation present. No thyromegaly present.  Cardiovascular: Normal rate, regular rhythm and intact distal pulses.   Murmur heard. Pulmonary/Chest: Effort normal. She has wheezes. She exhibits no tenderness.  Abdominal: Soft. Bowel sounds are normal.  Musculoskeletal: Normal range of motion. She exhibits no edema and no tenderness.  Lymphadenopathy:    She has no cervical adenopathy.  Neurological: She is alert and oriented to person, place, and time. She has normal reflexes. No cranial nerve deficit.  Skin: Skin is warm and dry. She is not diaphoretic.  Psychiatric: She has a normal mood and affect. Her behavior is normal.          Assessment & Plan:

## 2012-03-31 NOTE — Patient Instructions (Signed)
The patient is instructed to continue all medications as prescribed. Schedule followup with check out clerk upon leaving the clinic  

## 2012-04-06 ENCOUNTER — Other Ambulatory Visit: Payer: Self-pay | Admitting: Internal Medicine

## 2012-05-04 ENCOUNTER — Other Ambulatory Visit: Payer: Self-pay | Admitting: Internal Medicine

## 2012-05-18 ENCOUNTER — Ambulatory Visit: Payer: Medicare Other | Admitting: Family

## 2012-05-26 ENCOUNTER — Encounter: Payer: Self-pay | Admitting: Family Medicine

## 2012-05-26 ENCOUNTER — Ambulatory Visit (INDEPENDENT_AMBULATORY_CARE_PROVIDER_SITE_OTHER): Payer: Medicare Other | Admitting: Family Medicine

## 2012-05-26 VITALS — BP 124/80 | HR 96 | Temp 98.8°F | Wt 164.0 lb

## 2012-05-26 DIAGNOSIS — N760 Acute vaginitis: Secondary | ICD-10-CM

## 2012-05-26 DIAGNOSIS — N390 Urinary tract infection, site not specified: Secondary | ICD-10-CM

## 2012-05-26 DIAGNOSIS — A499 Bacterial infection, unspecified: Secondary | ICD-10-CM

## 2012-05-26 DIAGNOSIS — B9689 Other specified bacterial agents as the cause of diseases classified elsewhere: Secondary | ICD-10-CM

## 2012-05-26 LAB — POCT URINALYSIS DIPSTICK
Bilirubin, UA: NEGATIVE
Glucose, UA: NEGATIVE
Nitrite, UA: NEGATIVE

## 2012-05-26 MED ORDER — SULFAMETHOXAZOLE-TRIMETHOPRIM 800-160 MG PO TABS: 1.0000 | ORAL_TABLET | Freq: Two times a day (BID) | ORAL | Status: DC

## 2012-05-26 MED ORDER — METRONIDAZOLE 0.75 % VA GEL: 1.0000 | Freq: Two times a day (BID) | VAGINAL | Status: DC

## 2012-05-26 NOTE — Progress Notes (Signed)
  Subjective:    Patient ID: Sonya Franco, female    DOB: 01/08/1941, 71 y.o.   MRN: [id number]  HPI Here for what she thinks is a UTI and a vaginal infection. She says she gets what sounds like bacterial vaginitis several times a year, and she usually uses metronidazole gel for this. She started having urinary burning and urgency several weeks ago, and she went to Urgent Care on 05-19-12. She was diagnosed with a UTI and was given a 3 days supply of Cipro. This upset her stomach and gave her diarrhea, so she went back to Urgent Care. They then said she had a vaginal infection and gave her oral Metronidazole. She took this for a few days but stopped it because it also gave her diarrhea. Today she asks to be put on "a sulfa drug" for her UTI because she still burns on urination. She also asks for vaginal Metrogel. No fever or nausea.    Review of Systems  Constitutional: Negative.   Gastrointestinal: Negative.   Genitourinary: Positive for dysuria, urgency and frequency.  Musculoskeletal: Positive for back pain.       Objective:   Physical Exam  Constitutional: She appears well-developed and well-nourished.  Abdominal: Soft. Bowel sounds are normal. She exhibits no distension and no mass. There is no tenderness. There is no rebound and no guarding.          Assessment & Plan:  Partially treated UTI and possible vaginitis. Given a week of Septra DS, and we will culture her urine sample. Try Metrogel vaginal as well.

## 2012-05-26 NOTE — Addendum Note (Signed)
Addended by: Aniceto Boss A on: 05/26/2012 10:11 AM   Modules accepted: Orders

## 2012-05-28 LAB — URINE CULTURE

## 2012-05-30 NOTE — Progress Notes (Signed)
Quick Note:  I spoke with pt ______ 

## 2012-06-14 ENCOUNTER — Other Ambulatory Visit: Payer: Self-pay | Admitting: Gynecology

## 2012-06-14 DIAGNOSIS — Z1231 Encounter for screening mammogram for malignant neoplasm of breast: Secondary | ICD-10-CM

## 2012-06-16 ENCOUNTER — Other Ambulatory Visit: Payer: Self-pay | Admitting: Internal Medicine

## 2012-07-04 ENCOUNTER — Ambulatory Visit (HOSPITAL_COMMUNITY): Payer: Medicare Other

## 2012-07-18 ENCOUNTER — Ambulatory Visit: Payer: Medicare Other | Admitting: Family Medicine

## 2012-07-18 ENCOUNTER — Ambulatory Visit (INDEPENDENT_AMBULATORY_CARE_PROVIDER_SITE_OTHER): Payer: Medicare Other | Admitting: Internal Medicine

## 2012-07-18 ENCOUNTER — Encounter: Payer: Self-pay | Admitting: Internal Medicine

## 2012-07-18 VITALS — BP 124/80 | HR 72 | Temp 98.4°F | Resp 16 | Ht 61.5 in | Wt 160.0 lb

## 2012-07-18 DIAGNOSIS — J019 Acute sinusitis, unspecified: Secondary | ICD-10-CM

## 2012-07-18 DIAGNOSIS — J0100 Acute maxillary sinusitis, unspecified: Secondary | ICD-10-CM

## 2012-07-18 DIAGNOSIS — J309 Allergic rhinitis, unspecified: Secondary | ICD-10-CM

## 2012-07-18 MED ORDER — FLUTICASONE PROPIONATE 50 MCG/ACT NA SUSP: 2.0000 | Freq: Every day | NASAL | Status: DC

## 2012-07-18 MED ORDER — LEVOFLOXACIN 500 MG PO TABS: 500.0000 mg | ORAL_TABLET | Freq: Every day | ORAL | Status: DC

## 2012-07-18 NOTE — Progress Notes (Signed)
  Subjective:    Patient ID: Sonya Franco, female    DOB: 1940-12-11, 71 y.o.   MRN: [id number]  HPI Pateint noted blurred vision that has progressed to sore skin  Of the scalp on the right side She has noted facial tenderness and congestion. She noted "watering" of eyes and sneezing No fever/ chills.    Review of Systems  Constitutional: Negative for activity change, appetite change and fatigue.  HENT: Negative for ear pain, congestion, neck pain, postnasal drip and sinus pressure.   Eyes: Negative for redness and visual disturbance.  Respiratory: Negative for cough, shortness of breath and wheezing.   Gastrointestinal: Negative for abdominal pain and abdominal distention.  Genitourinary: Negative for dysuria, frequency and menstrual problem.  Musculoskeletal: Negative for myalgias, joint swelling and arthralgias.  Skin: Negative for rash and wound.  Neurological: Negative for dizziness, weakness and headaches.  Hematological: Negative for adenopathy. Does not bruise/bleed easily.  Psychiatric/Behavioral: Negative for sleep disturbance and decreased concentration.       Objective:   Physical Exam  Constitutional: She is oriented to person, place, and time. She appears well-developed and well-nourished. No distress.  HENT:       Purulent discharge from the right nasal passages with mucoid cyst and marked erythema  Eyes: Conjunctivae normal and EOM are normal. Pupils are equal, round, and reactive to light.  Neck: Normal range of motion. Neck supple. No JVD present. No tracheal deviation present. No thyromegaly present.  Cardiovascular: Normal rate, regular rhythm, normal heart sounds and intact distal pulses.   No murmur heard. Pulmonary/Chest: Effort normal and breath sounds normal. She has no wheezes. She exhibits no tenderness.  Abdominal: Soft. Bowel sounds are normal.  Musculoskeletal: Normal range of motion. She exhibits no edema and no tenderness.  Lymphadenopathy:     She has no cervical adenopathy.  Neurological: She is alert and oriented to person, place, and time. She has normal reflexes. No cranial nerve deficit.  Skin: Skin is warm and dry. She is not diaphoretic.  Psychiatric: She has a normal mood and affect. Her behavior is normal.          Assessment & Plan:  2 acute on chronic sinusitis with a mucoid cyst on her right sinus  We'll start a steroid nasal spray and antibiotics Loss of the sedimentation rate to make sure that this does not represent an arthritis

## 2012-07-18 NOTE — Patient Instructions (Signed)
The patient is instructed to continue all medications as prescribed. Schedule followup with check out clerk upon leaving the clinic  

## 2012-07-19 ENCOUNTER — Ambulatory Visit (HOSPITAL_COMMUNITY): Payer: Medicare Other

## 2012-07-25 ENCOUNTER — Ambulatory Visit (INDEPENDENT_AMBULATORY_CARE_PROVIDER_SITE_OTHER): Payer: Medicare Other | Admitting: Gynecology

## 2012-07-25 ENCOUNTER — Encounter: Payer: Self-pay | Admitting: Gynecology

## 2012-07-25 VITALS — BP 130/80 | Ht 60.0 in | Wt 162.0 lb

## 2012-07-25 DIAGNOSIS — N816 Rectocele: Secondary | ICD-10-CM

## 2012-07-25 DIAGNOSIS — N952 Postmenopausal atrophic vaginitis: Secondary | ICD-10-CM

## 2012-07-25 DIAGNOSIS — N8111 Cystocele, midline: Secondary | ICD-10-CM

## 2012-07-25 DIAGNOSIS — Z79890 Hormone replacement therapy (postmenopausal): Secondary | ICD-10-CM

## 2012-07-25 DIAGNOSIS — M545 Low back pain: Secondary | ICD-10-CM

## 2012-07-25 MED ORDER — CONJ ESTROG-MEDROXYPROGEST ACE 0.3-1.5 MG PO TABS: 1.0000 | ORAL_TABLET | ORAL | Status: DC

## 2012-07-25 NOTE — Progress Notes (Signed)
Sonya Franco [birth year] [id number]        72 y.o.  [id number] for follow up exam.  Several issues that are below.  Past medical history,surgical history, medications, allergies, family history and social history were all reviewed and documented in the EPIC chart. ROS:  Was performed and pertinent positives and negatives are included in the history.  Exam: Kim assistant Filed Vitals:   07/25/12 1024  BP: 130/80  Height: 5' (1.524 m)  Weight: 162 lb (73.483 kg)   General appearance  Normal Skin grossly normal Head/Neck normal with no cervical or supraclavicular adenopathy thyroid normal Lungs  clear Cardiac RR, without RMG Abdominal  soft, nontender, without masses, organomegaly or hernia Breasts  examined lying and sitting without masses, retractions, discharge or axillary adenopathy. Pelvic  Ext/BUS/vagina  Atrophic changes with first degree cystocele and first to second degree rectocele.  Cervix  Atrophic flush with the upper vagina difficult to visualize.  Uterus difficult to palpate. Grossly normal in size.  Adnexa  Without masses or tenderness    Anus and perineum  normal   Rectovaginal  normal sphincter tone without palpated masses or tenderness.    Assessment/Plan:  72 y.o. [id number] female for follow up exam.   1. HRT. Patient had tried stopping her Prempro 0.3/1.5. Had hot flushes night sweats and sleep disturbances and has restarted. I again reviewed the issues of HRT, her age, smoking and risks to include the WHI study with increased risk of stroke heart attack DVT and increased risk of breast cancer. Alternative nonhormonal options reviewed. Alternative hormonal options to include transdermal also discussed the patient is not interested and would prefer to continue on her HRT from a quality of life standpoint and accepts the risks. Prempro 0.3/1.5 prescribed x1 year. Report any bleeding stressed. 2. Rectocele/cystocele.  Exam is stable and patient is asymptomatic and  we'll continue to monitor. 3. Atrophic vaginal changes. Patient's asymptomatic and we'll continue to monitor. 4. Pap smear. No Pap smear done today. Last Pap smear 2010. No history of abnormal Pap smears ever. We have decided to stop screening per current screening guidelines as she is over the age of 66 with no history of abnormal Pap smears and she is comfortable with this. 5. Bone density. Patient reports having done through Dr. Lovell Sheehan office but she cannot remember when her last one was done. I offer to schedule here but she declined said that she will follow up with Dr. Lovell Sheehan in reference to this.  Increase calcium vitamin D reviewed. 6. Colonoscopy. Patients never had and she again refuses to schedule. I gave her OC Light to check her stool with instructions to mail his back make sure that she calls [place] and that we receive them and it is negative. She clearly understands that this is not to replace colonoscopy and the potential for colon cancer reviewed and again she refuses colonoscopy. 7. Mammography. Patient has scheduled for February 2014 will follow up for this. SBE monthly reviewed. 8. Low back pain. Patient reports right sided flank to low back pain. Will check urinalysis.  Notes that this is chronic and actually discussed this with Dr. Lovell Sheehan in September but no follow up was arranged. I recommended she follow up with Dr. Lovell Sheehan in reference to this or possibly with an orthopedist and she agrees with this. 9. Stop smoking. I again discussed smoking strategies and the harm of smoking. 10. Health maintenance. Patient had checkup with Dr. Lovell Sheehan September with blood work. The blood work done  today. Follow up one year, sooner as needed.    Dara Lords MD, 10:59 AM 07/25/2012

## 2012-07-25 NOTE — Patient Instructions (Addendum)
Continue on hormone replacement as we discussed. Report any bleeding or any issues.  Returned the stool card to check for blood in your stool. Collis to make sure we received it  Follow up with Dr. Lovell Sheehan or orthopedist in reference to your back.  Follow up for your mammogram as scheduled.  Follow up one year for GYN exam.   Consider Stop Smoking.  Help is available at [facility] smoking cessation program @ www.[place].com or 458-282-7001. OR 1-800-QUIT-NOW 954 032 1729) for free smoking cessation counseling.  Smokefree.gov (http://www.[place].com/) provides free, accurate, evidence-based information and professional assistance to help support the immediate and long-term needs of people trying to quit smoking.    Smoking Hazards Smoking cigarettes is extremely bad for your health. Tobacco smoke has over 200 known poisons in it. There are over 60 chemicals in tobacco smoke that cause cancer. Some of the chemicals found in cigarette smoke include:  Cyanide.  Benzene.  Formaldehyde.  Methanol (wood alcohol).  Acetylene (fuel used in welding torches).  Ammonia.  Cigarette smoke also contains the poisonous gases nitrogen oxide and carbon monoxide.  Cigarette smokers have an increased risk of many serious medical problems, including: Lung cancer.  Lung disease (such as pneumonia, bronchitis, and emphysema).  Heart attack and chest pain due to the heart not getting enough oxygen (angina).  Heart disease and peripheral blood vessel disease.  Hypertension.  Stroke.  Oral cancer (cancer of the lip, mouth, or voice box).  Bladder cancer.  Pancreatic cancer.  Cervical cancer.  Pregnancy complications, including premature birth.  Low birthweight babies.  Early menopause.  Lower estrogen level for women.  Infertility.  Facial wrinkles.  Blindness.  Increased risk of broken bones (fractures).  Senile dementia.  Stillbirths and smaller newborn babies, birth defects, and  genetic damage to sperm.  Stomach ulcers and internal bleeding.  Children of smokers have an increased risk of the following, because of secondhand smoke exposure:  Sudden infant death syndrome (SIDS).  Respiratory infections.  Lung cancer.  Heart disease.  Ear infections.  Smoking causes approximately: 90% of all lung cancer deaths in men.  80% of all lung cancer deaths in women.  90% of deaths from chronic obstructive lung disease.  Compared with nonsmokers, smoking increases the risk of: Coronary heart disease by 2 to 4 times.  Stroke by 2 to 4 times.  Men developing lung cancer by 23 times.  Women developing lung cancer by 13 times.  Dying from chronic obstructive lung diseases by 12 times.  Someone who smokes 2 packs a day loses about 8 years of his or her life. Even smoking lightly shortens your life expectancy by several years. You can greatly reduce the risk of medical problems for you and your family by stopping now. Smoking is the most preventable cause of death and disease in our society. Within days of quitting smoking, your circulation returns to normal, you decrease the risk of having a heart attack, and your lung capacity improves. There may be some increased phlegm in the first few days after quitting, and it may take months for your lungs to clear up completely. Quitting for 10 years cuts your lung cancer risk to almost that of a nonsmoker. WHY IS SMOKING ADDICTIVE? Nicotine is the chemical agent in tobacco that is capable of causing addiction or dependence.  When you smoke and inhale, nicotine is absorbed rapidly into the bloodstream through your lungs. Nicotine absorbed through the lungs is capable of creating a powerful addiction. Both inhaled and  non-inhaled nicotine may be addictive.  Addiction studies of cigarettes and spit tobacco show that addiction to nicotine occurs mainly during the teen years, when young people begin using tobacco products.  WHAT ARE THE BENEFITS  OF QUITTING?  There are many health benefits to quitting smoking.  Likelihood of developing cancer and heart disease decreases. Health improvements are seen almost immediately.  Blood pressure, pulse rate, and breathing patterns start returning to normal soon after quitting.  People who quit may see an improvement in their overall quality of life.  Some people choose to quit all at once. Other options include nicotine replacement products, such as patches, gum, and nasal sprays. Do not use these products without first checking with your caregiver. QUITTING SMOKING It is not easy to quit smoking. Nicotine is addicting, and longtime habits are hard to change. To start, you can write down all your reasons for quitting, tell your family and friends you want to quit, and ask for their help. Throw your cigarettes away, chew gum or cinnamon sticks, keep your hands busy, and drink extra water or juice. Go for walks and practice deep breathing to relax. Think of all the money you are saving: around $1,000 a year, for the average pack-a-day smoker. Nicotine patches and gum have been shown to improve success at efforts to stop smoking. Zyban (bupropion) is an anti-depressant drug that can be prescribed to reduce nicotine withdrawal symptoms and to suppress the urge to smoke. Smoking is an addiction with both physical and psychological effects. Joining a stop-smoking support group can help you cope with the emotional issues. For more information and advice on programs to stop smoking, call your doctor, your local hospital, or these organizations: American Lung Association - 1-800-LUNGUSA   Smoking Cessation  This document explains the best ways for you to quit smoking and new treatments to help. It lists new medicines that can double or triple your chances of quitting and quitting for good. It also considers ways to avoid relapses and concerns you may have about quitting, including weight gain. NICOTINE: A  POWERFUL ADDICTION If you have tried to quit smoking, you know how hard it can be. It is hard because nicotine is a very addictive drug. For some people, it can be as addictive as heroin or cocaine. Usually, people make 2 or 3 tries, or more, before finally being able to quit. Each time you try to quit, you can learn about what helps and what hurts. Quitting takes hard work and a lot of effort, but you can quit smoking. QUITTING SMOKING IS ONE OF THE MOST IMPORTANT THINGS YOU WILL EVER DO.  You will live longer, feel better, and live better.   The impact on your body of quitting smoking is felt almost immediately:   Within 20 minutes, blood pressure decreases. Pulse returns to its normal level.   After 8 hours, carbon monoxide levels in the blood return to normal. Oxygen level increases.   After 24 hours, chance of heart attack starts to decrease. Breath, hair, and body stop smelling like smoke.   After 48 hours, damaged nerve endings begin to recover. Sense of taste and smell improve.   After 72 hours, the body is virtually free of nicotine. Bronchial tubes relax and breathing becomes easier.   After 2 to 12 weeks, lungs can hold more air. Exercise becomes easier and circulation improves.   Quitting will reduce your risk of having a heart attack, stroke, cancer, or lung disease:   After  1 year, the risk of coronary heart disease is cut in half.   After 5 years, the risk of stroke falls to the same as a nonsmoker.   After 10 years, the risk of lung cancer is cut in half and the risk of other cancers decreases significantly.   After 15 years, the risk of coronary heart disease drops, usually to the level of a nonsmoker.   If you are pregnant, quitting smoking will improve your chances of having a healthy baby.   The people you live with, especially your children, will be healthier.   You will have extra money to spend on things other than cigarettes.  FIVE KEYS TO QUITTING Studies  have shown that these 5 steps will help you quit smoking and quit for good. You have the best chances of quitting if you use them together: 1. Get ready.  2. Get support and encouragement.  3. Learn new skills and behaviors.  4. Get medicine to reduce your nicotine addiction and use it correctly.  5. Be prepared for relapse or difficult situations. Be determined to continue trying to quit, even if you do not succeed at first.  1. GET READY  Set a quit date.   Change your environment.   Get rid of ALL cigarettes, ashtrays, matches, and lighters in your home, car, and place of work.   Do not let people smoke in your home.   Review your past attempts to quit. Think about what worked and what did not.   Once you quit, do not smoke. NOT EVEN A PUFF!  2. GET SUPPORT AND ENCOURAGEMENT Studies have shown that you have a better chance of being successful if you have help. You can get support in many ways.  Tell your family, friends, and coworkers that you are going to quit and need their support. Ask them not to smoke around you.   Talk to your caregivers (doctor, dentist, nurse, pharmacist, psychologist, and/or smoking counselor).   Get individual, group, or telephone counseling and support. The more counseling you have, the better your chances are of quitting. Programs are available at [employer] and health centers. Call your local health department for information about programs in your area.   Spiritual beliefs and practices may help some smokers quit.   Quit meters are Photographer that keep track of quit statistics, such as amount of "quit-time," cigarettes not smoked, and money saved.   Many smokers find one or more of the many self-help books available useful in helping them quit and stay off tobacco.  3. LEARN NEW SKILLS AND BEHAVIORS  Try to distract yourself from urges to smoke. Talk to someone, go for a walk, or occupy your time with a  task.   When you first try to quit, change your routine. Take a different route to work. Drink tea instead of coffee. Eat breakfast in a different place.   Do something to reduce your stress. Take a hot bath, exercise, or read a book.   Plan something enjoyable to do every day. Reward yourself for not smoking.   Explore interactive web-based programs that specialize in helping you quit.  4. GET MEDICINE AND USE IT CORRECTLY Medicines can help you stop smoking and decrease the urge to smoke. Combining medicine with the above behavioral methods and support can quadruple your chances of successfully quitting smoking. The [place] Food and Drug Administration (FDA) has approved 7 medicines to help you quit smoking. These medicines  fall into 3 categories.  Nicotine replacement therapy (delivers nicotine to your body without the negative effects and risks of smoking):   Nicotine gum: Available over-the-counter.   Nicotine lozenges: Available over-the-counter.   Nicotine inhaler: Available by prescription.   Nicotine nasal spray: Available by prescription.   Nicotine skin patches (transdermal): Available by prescription and over-the-counter.   Antidepressant medicine (helps people abstain from smoking, but how this works is unknown):   Bupropion sustained-release (SR) tablets: Available by prescription.   Nicotinic receptor partial agonist (simulates the effect of nicotine in your brain):   Varenicline tartrate tablets: Available by prescription.   Ask your caregiver for advice about which medicines to use and how to use them. Carefully read the information on the package.   Everyone who is trying to quit may benefit from using a medicine. If you are pregnant or trying to become pregnant, nursing an infant, you are under age 27, or you smoke fewer than 10 cigarettes per day, talk to your caregiver before taking any nicotine replacement medicines.   You should stop using a nicotine  replacement product and call your caregiver if you experience nausea, dizziness, weakness, vomiting, fast or irregular heartbeat, mouth problems with the lozenge or gum, or redness or swelling of the skin around the patch that does not go away.   Do not use any other product containing nicotine while using a nicotine replacement product.   Talk to your caregiver before using these products if you have diabetes, heart disease, asthma, stomach ulcers, you had a recent heart attack, you have high blood pressure that is not controlled with medicine, a history of irregular heartbeat, or you have been prescribed medicine to help you quit smoking.  5. BE PREPARED FOR RELAPSE OR DIFFICULT SITUATIONS  Most relapses occur within the first 3 months after quitting. Do not be discouraged if you start smoking again. Remember, most people try several times before they finally quit.   You may have symptoms of withdrawal because your body is used to nicotine. You may crave cigarettes, be irritable, feel very hungry, cough often, get headaches, or have difficulty concentrating.   The withdrawal symptoms are only temporary. They are strongest when you first quit, but they will go away within 10 to 14 days.  Here are some difficult situations to watch for:  Alcohol. Avoid drinking alcohol. Drinking lowers your chances of successfully quitting.   Caffeine. Try to reduce the amount of caffeine you consume. It also lowers your chances of successfully quitting.   Other smokers. Being around smoking can make you want to smoke. Avoid smokers.   Weight gain. Many smokers will gain weight when they quit, usually less than 10 pounds. Eat a healthy diet and stay active. Do not let weight gain distract you from your main goal, quitting smoking. Some medicines that help you quit smoking may also help delay weight gain. You can always lose the weight gained after you quit.   Bad mood or depression. There are a lot of ways to  improve your mood other than smoking.  If you are having problems with any of these situations, talk to your caregiver. SPECIAL SITUATIONS AND CONDITIONS Studies suggest that everyone can quit smoking. Your situation or condition can give you a special reason to quit.  Pregnant women/new mothers: By quitting, you protect your baby's health and your own.   Hospitalized patients: By quitting, you reduce health problems and help healing.   Heart attack patients: By quitting, you  reduce your risk of a second heart attack.   Lung, head, and neck cancer patients: By quitting, you reduce your chance of a second cancer.   Parents of children and adolescents: By quitting, you protect your children from illnesses caused by secondhand smoke.  QUESTIONS TO THINK ABOUT Think about the following questions before you try to stop smoking. You may want to talk about your answers with your caregiver.  Why do you want to quit?   If you tried to quit in the past, what helped and what did not?   What will be the most difficult situations for you after you quit? How will you plan to handle them?   Who can help you through the tough times? Your family? Friends? Caregiver?   What pleasures do you get from smoking? What ways can you still get pleasure if you quit?  Here are some questions to ask your caregiver:  How can you help me to be successful at quitting?   What medicine do you think would be best for me and how should I take it?   What should I do if I need more help?   What is smoking withdrawal like? How can I get information on withdrawal?  Quitting takes hard work and a lot of effort, but you can quit smoking.

## 2012-07-26 ENCOUNTER — Telehealth: Payer: Self-pay | Admitting: *Deleted

## 2012-07-26 ENCOUNTER — Other Ambulatory Visit: Payer: Self-pay | Admitting: Gynecology

## 2012-07-26 LAB — URINALYSIS W MICROSCOPIC + REFLEX CULTURE
Casts: NONE SEEN
Hgb urine dipstick: NEGATIVE
Leukocytes, UA: NEGATIVE
Nitrite: NEGATIVE
pH: 7.5 (ref 5.0–8.0)

## 2012-07-26 MED ORDER — CONJ ESTROG-MEDROXYPROGEST ACE 0.3-1.5 MG PO TABS: 1.0000 | ORAL_TABLET | ORAL | Status: DC

## 2012-07-26 NOTE — Telephone Encounter (Signed)
rx signed and husband will pick up Rx.

## 2012-07-26 NOTE — Telephone Encounter (Signed)
Pt husband called and said Prempro is too expensive at current pharmacy, husband has found a pharmacy were medication is much cheaper. He would like Rx printed and signed so he can fill Prempro. I will print and place on your desk to be signed.

## 2012-07-28 ENCOUNTER — Ambulatory Visit: Payer: Medicare Other | Admitting: Internal Medicine

## 2012-08-12 ENCOUNTER — Other Ambulatory Visit: Payer: Self-pay | Admitting: Internal Medicine

## 2012-08-16 ENCOUNTER — Ambulatory Visit (HOSPITAL_COMMUNITY)
Admission: RE | Admit: 2012-08-16 | Discharge: 2012-08-16 | Disposition: A | Discharge status: Home / Self Care | Payer: Medicare Other | Source: Ambulatory Visit | Attending: Gynecology | Admitting: Gynecology

## 2012-08-16 DIAGNOSIS — Z1231 Encounter for screening mammogram for malignant neoplasm of breast: Secondary | ICD-10-CM | POA: Insufficient documentation

## 2012-08-17 ENCOUNTER — Other Ambulatory Visit: Payer: Self-pay | Admitting: Gynecology

## 2012-08-17 ENCOUNTER — Other Ambulatory Visit: Payer: Self-pay | Admitting: Anesthesiology

## 2012-08-17 DIAGNOSIS — Z1211 Encounter for screening for malignant neoplasm of colon: Secondary | ICD-10-CM

## 2012-10-03 ENCOUNTER — Encounter: Payer: Self-pay | Admitting: Internal Medicine

## 2012-10-03 ENCOUNTER — Ambulatory Visit (INDEPENDENT_AMBULATORY_CARE_PROVIDER_SITE_OTHER): Payer: Medicare Other | Admitting: Internal Medicine

## 2012-10-03 VITALS — BP 130/80 | HR 80 | Temp 98.1°F | Resp 16 | Ht 61.5 in | Wt 168.0 lb

## 2012-10-03 DIAGNOSIS — R635 Abnormal weight gain: Secondary | ICD-10-CM

## 2012-10-03 DIAGNOSIS — D72829 Elevated white blood cell count, unspecified: Secondary | ICD-10-CM

## 2012-10-03 DIAGNOSIS — R609 Edema, unspecified: Secondary | ICD-10-CM

## 2012-10-03 DIAGNOSIS — F4323 Adjustment disorder with mixed anxiety and depressed mood: Secondary | ICD-10-CM

## 2012-10-03 LAB — BASIC METABOLIC PANEL
CO2: 25 mEq/L (ref 19–32)
Calcium: 9.3 mg/dL (ref 8.4–10.5)
Glucose, Bld: 100 mg/dL — ABNORMAL HIGH (ref 70–99)
Potassium: 5.4 mEq/L — ABNORMAL HIGH (ref 3.5–5.1)
Sodium: 136 mEq/L (ref 135–145)

## 2012-10-03 LAB — CBC WITH DIFFERENTIAL/PLATELET
Basophils Relative: 0.2 % (ref 0.0–3.0)
Eosinophils Absolute: 0.2 10*3/uL (ref 0.0–0.7)
HCT: 43.4 % (ref 36.0–46.0)
Hemoglobin: 14.6 g/dL (ref 12.0–15.0)
Lymphs Abs: 3.3 10*3/uL (ref 0.7–4.0)
MCHC: 33.6 g/dL (ref 30.0–36.0)
MCV: 92.7 fl (ref 78.0–100.0)
Monocytes Absolute: 0.7 10*3/uL (ref 0.1–1.0)
Neutro Abs: 8 10*3/uL — ABNORMAL HIGH (ref 1.4–7.7)
RBC: 4.68 Mil/uL (ref 3.87–5.11)

## 2012-10-03 MED ORDER — LORAZEPAM 1 MG PO TABS: ORAL_TABLET | ORAL | Status: DC

## 2012-10-03 MED ORDER — INDAPAMIDE 1.25 MG PO TABS: 0.6250 mg | ORAL_TABLET | ORAL | Status: DC

## 2012-10-03 NOTE — Progress Notes (Signed)
  Subjective:    Patient ID: Sonya Franco, female    DOB: [birth year], 72 y.o.   MRN: [id number]  HPI Patient is a 72 year old female who noticed increased weight gain over the past3 months.    Scale she's gained 6 pounds she does notice some increased swelling in her feet. She has mild shortness of breath the diagnosis of COPD (smoker) No reported chest pain. No passing out or syncopal episodes. Onset of symptoms has been gradual   Review of Systems  Constitutional: Negative.   HENT: Negative.   Respiratory: Positive for shortness of breath.   Cardiovascular: Positive for leg swelling. Negative for chest pain and palpitations.  Gastrointestinal: Positive for abdominal distention. Negative for abdominal pain.  Endocrine: Negative.   Genitourinary: Positive for frequency.  Allergic/Immunologic: Negative.   Neurological: Negative.   Hematological: Negative.        Objective:   Physical Exam  Vitals reviewed. Constitutional: She is oriented to person, place, and time. She appears well-developed and well-nourished. No distress.  HENT:  Head: Normocephalic and atraumatic.  Right Ear: External ear normal.  Left Ear: External ear normal.  Nose: Nose normal.  Mouth/Throat: Oropharynx is clear and moist.  Eyes: Conjunctivae and EOM are normal. Pupils are equal, round, and reactive to light.  Neck: Normal range of motion. Neck supple. No JVD present. No tracheal deviation present. No thyromegaly present.  Cardiovascular: Normal rate, regular rhythm, normal heart sounds and intact distal pulses.   No murmur heard. Pulmonary/Chest: Effort normal. She has no wheezes. She has rales. She exhibits no tenderness.  Slight crackles at right base  Abdominal: Soft. Bowel sounds are normal.  Musculoskeletal: Normal range of motion. She exhibits no edema and no tenderness.  Lymphadenopathy:    She has no cervical adenopathy.  Neurological: She is alert and oriented to person, place,  and time. She has normal reflexes. No cranial nerve deficit.  Skin: Skin is warm and dry. She is not diaphoretic.  Psychiatric: She has a normal mood and affect. Her behavior is normal.          Assessment & Plan:  Weight gain of unknown etiology. 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 Low-dose diuretic Wean off hormone therapy Thyroid, metabolic panel today COPD stable but needs to stop smoking!

## 2012-10-03 NOTE — Patient Instructions (Signed)
Advised patient to stop HRT   Due to fluid retention. Could go to every third day for 2-3 weeks to wean off medication

## 2012-10-03 NOTE — Addendum Note (Signed)
Addended by: Stacie Glaze on: 10/03/2012 12:49 PM   Modules accepted: Orders

## 2012-10-17 ENCOUNTER — Other Ambulatory Visit: Payer: Self-pay | Admitting: Internal Medicine

## 2012-11-02 ENCOUNTER — Other Ambulatory Visit: Payer: Self-pay | Admitting: Internal Medicine

## 2013-01-02 ENCOUNTER — Other Ambulatory Visit: Payer: Self-pay | Admitting: Internal Medicine

## 2013-01-02 ENCOUNTER — Telehealth: Payer: Self-pay | Admitting: Internal Medicine

## 2013-01-02 MED ORDER — METRONIDAZOLE 0.75 % EX GEL: Freq: Two times a day (BID) | CUTANEOUS | Status: DC

## 2013-01-02 NOTE — Telephone Encounter (Signed)
May have metronidazole .75% vaginal gell Ok per dr Lovell Sheehan and done

## 2013-01-02 NOTE — Telephone Encounter (Signed)
Pt needs refill on metronidazole vaginal gel call into [facility] [address]

## 2013-01-18 ENCOUNTER — Ambulatory Visit: Payer: Medicare Other | Admitting: Internal Medicine

## 2013-01-18 ENCOUNTER — Encounter: Payer: Self-pay | Admitting: Internal Medicine

## 2013-01-18 ENCOUNTER — Ambulatory Visit (INDEPENDENT_AMBULATORY_CARE_PROVIDER_SITE_OTHER): Payer: Medicare Other | Admitting: Internal Medicine

## 2013-01-18 VITALS — BP 114/74 | HR 84 | Temp 99.2°F | Wt 168.0 lb

## 2013-01-18 DIAGNOSIS — R079 Chest pain, unspecified: Secondary | ICD-10-CM

## 2013-01-18 DIAGNOSIS — F4323 Adjustment disorder with mixed anxiety and depressed mood: Secondary | ICD-10-CM

## 2013-01-18 DIAGNOSIS — I208 Other forms of angina pectoris: Secondary | ICD-10-CM

## 2013-01-18 DIAGNOSIS — I209 Angina pectoris, unspecified: Secondary | ICD-10-CM

## 2013-01-18 DIAGNOSIS — E785 Hyperlipidemia, unspecified: Secondary | ICD-10-CM

## 2013-01-18 LAB — LIPID PANEL
Cholesterol: 201 mg/dL — ABNORMAL HIGH (ref 0–200)
Total CHOL/HDL Ratio: 4
Triglycerides: 105 mg/dL (ref 0.0–149.0)
VLDL: 21 mg/dL (ref 0.0–40.0)

## 2013-01-18 MED ORDER — LORAZEPAM 1 MG PO TABS: ORAL_TABLET | ORAL | Status: DC

## 2013-01-18 NOTE — Progress Notes (Signed)
  Subjective:    Patient ID: Sonya Franco, female    DOB: 1940-09-06, 72 y.o.   MRN: [id number]  HPI  Followed for HTN and COPD History of GERD on prilosec 40 with good control ( takes in AM on empty stomach) Breathing stable Has occasional tightness in chest with "over exertion" and anxiety with arms feeling funny  Review of Systems     Objective:   Physical Exam        Assessment & Plan:  Atypical anginal pain syndrome? High risk profile  Smoker, lipids age and HRT use place her at increased risk Has bee on HRT every other day but has not been able to stop GERD stable

## 2013-01-26 ENCOUNTER — Encounter (HOSPITAL_COMMUNITY): Payer: Medicare Other

## 2013-02-15 ENCOUNTER — Other Ambulatory Visit: Payer: Self-pay | Admitting: Internal Medicine

## 2013-03-10 ENCOUNTER — Telehealth: Payer: Self-pay | Admitting: Internal Medicine

## 2013-03-10 MED ORDER — BACLOFEN 10 MG PO TABS: 10.0000 mg | ORAL_TABLET | Freq: Three times a day (TID) | ORAL | Status: DC

## 2013-03-10 NOTE — Telephone Encounter (Signed)
Patient Information:  Caller Name: Otilio Saber  Phone: [phone]  Patient: Sonya, Franco  Gender: Female  DOB: 1940/07/23  Age: 72 Years  PCP: Darryll Capers (Adults only)  Office Follow Up:  Does the office need to follow up with this patient?: Yes  Instructions For The Office: Requests [facility] relax[facility].   Symptoms  Reason For Call & Symptoms: Patient calling about back pain.  Relates it started shortly after she saw Dr. Lovell Sheehan in July 2014.  She reports "3 separate muscle spasms which stopped".  Left  back pain / soreness from hip to shoulder blade.  Pain rated at 5-6 of 10 with pain 10 of 10 upon getting out of bed in the am.  Emergent symptoms ruled out.  See Today or Tomorrow in Office per  Back Pain  protocol due to Pain radiates into the thigh or further down the leg.  Reviewed Health History In EMR: Yes  Reviewed Medications In EMR: Yes  Reviewed Allergies In EMR: Yes  Reviewed Surgeries / Procedures: Yes  Date of Onset of Symptoms: Unknown  Treatments Tried: Ice  Treatments Tried Worked: Yes  Guideline(s) Used:  Back Pain  Disposition Per Guideline:   See Today or Tomorrow in Office  Reason For Disposition Reached:   Pain radiates into the thigh or further down the leg  Advice Given:  Reassurance:  Twisting or heavy lifting can cause back pain.  Cold or Heat:  Cold Pack: For pain or swelling, use a cold pack or ice wrapped in a wet cloth. Put it on the sore area for 20 minutes. Repeat 4 times on the first day, then as needed.  Sleep:  Sleep on your side with a pillow between your knees. If you sleep on your back, put a pillow under your knees.  Avoid sleeping on your stomach.  Your mattress should be firm. Avoid waterbeds.  Activity  Keep doing your day-to-day activities if it is not too painful. Staying active is better than resting.  Avoid anything that makes your pain worse. Avoid heavy lifting, twisting, and too much exercise  until your back heals.  You do not need to stay in bed.  Pain Medicines:  Use the lowest amount of medicine that makes your pain feel better.  Acetaminophen (e.g., Tylenol):  Take 650 mg (two 325 mg pills) by mouth every 4-6 hours as needed. Each Regular Strength Tylenol pill has 325 mg of acetaminophen. The most you should take each day is 3,250 mg (10 Regular Strength pills a day).  Call Back If:  Numbness or weakness occur  Bowel/bladder problems occur  You become worse.  Patient Refused Recommendation:  Patient Requests Prescription  Asks for muscle relaxer

## 2013-03-10 NOTE — Telephone Encounter (Signed)
Per dr Lovell Sheehan - may have baclofen 10 tid0 sent to pharmacy

## 2013-04-04 ENCOUNTER — Ambulatory Visit (INDEPENDENT_AMBULATORY_CARE_PROVIDER_SITE_OTHER): Payer: Medicare Other

## 2013-04-04 DIAGNOSIS — Z23 Encounter for immunization: Secondary | ICD-10-CM

## 2013-04-07 ENCOUNTER — Other Ambulatory Visit: Payer: Self-pay | Admitting: Internal Medicine

## 2013-04-18 ENCOUNTER — Other Ambulatory Visit: Payer: Self-pay | Admitting: Internal Medicine

## 2013-04-25 ENCOUNTER — Encounter: Payer: Self-pay | Admitting: Internal Medicine

## 2013-04-25 ENCOUNTER — Ambulatory Visit (INDEPENDENT_AMBULATORY_CARE_PROVIDER_SITE_OTHER): Payer: Medicare Other | Admitting: Internal Medicine

## 2013-04-25 ENCOUNTER — Telehealth: Payer: Self-pay | Admitting: Internal Medicine

## 2013-04-25 VITALS — BP 120/72 | HR 88 | Temp 98.3°F | Resp 20 | Wt 172.0 lb

## 2013-04-25 DIAGNOSIS — L218 Other seborrheic dermatitis: Secondary | ICD-10-CM

## 2013-04-25 MED ORDER — HYDROCORTISONE 1 % EX OINT: TOPICAL_OINTMENT | Freq: Two times a day (BID) | CUTANEOUS | Status: DC

## 2013-04-25 NOTE — Progress Notes (Signed)
Subjective:    Patient ID: Sonya Franco, female    DOB: 04-23-1941, 72 y.o.   MRN: [id number]  HPI  72 year old patient who presents with a 2 to three-week history of a for a mild facial dermatitis involving her left forehead area;  she does have a prior history of seborrheic dermatosis. She does have a history of allergic rhinitis and has been on Zyrtec chronically.  Past Medical History  Diagnosis Date  . Postmenopausal HRT (hormone replacement therapy)   . Allergy   . GERD (gastroesophageal reflux disease)   . Arthritis   . Hemorrhoids     History   Social History  . Marital Status: Married    Spouse Name: N/A    Number of Children: N/A  . Years of Education: N/A   Occupational History  . retired    Social History Main Topics  . Smoking status: Current Every Day Smoker -- 0.30 packs/day    Types: Cigarettes  . Smokeless tobacco: Never Used  . Alcohol Use: No  . Drug Use: No  . Sexual Activity: Not Currently   Other Topics Concern  . Not on file   Social History Narrative  . No narrative on file    Past Surgical History  Procedure Laterality Date  . Cesarean section    . Cesarean section      3 times  . Tubal ligation    . Tonsillectomy    . Appendectomy      Family History  Problem Relation Age of Onset  . Multiple sclerosis Mother   . Thyroid disease Mother   . Other Mother     MS  . Stroke Father   . Hypertension Father   . Heart disease Father     Allergies  Allergen Reactions  . Diclofenac Other (See Comments)    Tinnitus and upset stomach    Current Outpatient Prescriptions on File Prior to Visit  Medication Sig Dispense Refill  . Calcium Carbonate (CALTRATE 600 PO) Take by mouth.      . cetirizine (ZYRTEC) 10 MG tablet Take 10 mg by mouth daily as needed.        . Doxylamine Succinate, Sleep, (UNISOM) 25 MG tablet Take 25 mg by mouth at bedtime as needed.        Marland Kitchen estrogen, conjugated,-medroxyprogesterone (PREMPRO) 0.3-1.5 MG  per tablet Take 1 tablet by mouth every other day.  30 tablet  11  . fluticasone (FLONASE) 50 MCG/ACT nasal spray Place 2 sprays into the nose daily.  16 g  6  . indapamide (LOZOL) 1.25 MG tablet Take 0.5 tablets (0.625 mg total) by mouth every morning.  30 tablet  6  . LORazepam (ATIVAN) 1 MG tablet TAKE 1 TABLET EVERY 6 HOURS AS NEEDED FOR ANXIETY  120 tablet  3  . metroNIDAZOLE (METROGEL) 0.75 % gel Apply topically 2 (two) times daily. Use  as directed  45 g  1  . multivitamin (THERAGRAN) per tablet Take 1 tablet by mouth daily.        Marland Kitchen omeprazole (PRILOSEC) 40 MG capsule TAKE ONE CAPSULE EVERY DAY  30 capsule  5  . PROAIR HFA 108 (90 BASE) MCG/ACT inhaler INHALE 2 PUFFS INTO THE LUNGS 2 (TWO) TIMES DAILY.  8.5 each  6  . PROCTOSOL HC 2.5 % rectal cream APPLY TO RECTUM AS NEEDED FOR HEMORRHOIDS  28.35 g  4  . meloxicam (MOBIC) 15 MG tablet TAKE 1 TABLET (15 MG TOTAL) BY MOUTH DAILY.  30 tablet  6  . [DISCONTINUED] albuterol (PROVENTIL) 90 MCG/ACT inhaler Inhale 2 puffs into the lungs 2 (two) times daily.  17 g  12   No current facility-administered medications on file prior to visit.    BP 120/72  Pulse 88  Temp(Src) 98.3 F (36.8 C) (Oral)  Resp 20  Wt 172 lb (78.019 kg)  BMI 31.98 kg/m2  SpO2 95%       Review of Systems  Constitutional: Negative.   HENT: Negative for congestion, dental problem, hearing loss, rhinorrhea, sinus pressure, sore throat and tinnitus.   Eyes: Negative for pain, discharge and visual disturbance.  Respiratory: Negative for cough and shortness of breath.   Cardiovascular: Negative for chest pain, palpitations and leg swelling.  Gastrointestinal: Negative for nausea, vomiting, abdominal pain, diarrhea, constipation, blood in stool and abdominal distention.  Genitourinary: Negative for dysuria, urgency, frequency, hematuria, flank pain, vaginal bleeding, vaginal discharge, difficulty urinating, vaginal pain and pelvic pain.  Musculoskeletal: Negative  for arthralgias, gait problem and joint swelling.  Skin: Positive for pallor. Negative for rash.  Neurological: Negative for dizziness, syncope, speech difficulty, weakness, numbness and headaches.  Hematological: Negative for adenopathy.  Psychiatric/Behavioral: Negative for behavioral problems, dysphoric mood and agitation. The patient is not nervous/anxious.        Objective:   Physical Exam  Constitutional: She appears well-developed and well-nourished. No distress.  Blood pressure 110/72  Skin: Rash noted.  Very  mild erythema and slight scaling involving her left fore head and eyebrow area          Assessment & Plan:   Probable mild seborrheic dermatosis. We'll treat with short term one percent hydrocortisone cream as well as intermittent shampoo and facial wash with a 2.5% selenium sulfide

## 2013-04-25 NOTE — Patient Instructions (Signed)
Use a selenium  sulfide 2.5% base shampoo 3 times weekly [facility])  Call or return to clinic prn if these symptoms worsen or fail to improve as anticipated.

## 2013-04-25 NOTE — Telephone Encounter (Signed)
Patient Information:  Caller Name: Harvel Quale  Phone: [phone]  Patient: Sonya Franco, Sonya Franco  Gender: Female  DOB: October 18, 1940  Age: 72 Years  PCP: Darryll Capers (Adults only)  Office Follow Up:  Does the office need to follow up with this patient?: No  Instructions For The Office: N/A   Symptoms  Reason For Call & Symptoms: Itchy papular rash on forehead, scalp itching and eyes feel irritated "like stuff is in them."  Suspected allergies so tried dry eye drops without improvement. Concerned symptoms related to flu shot approximately 04/04/13, 3 weeks ago.    Reviewed Health History In EMR: Yes  Reviewed Medications In EMR: Yes  Reviewed Allergies In EMR: Yes  Reviewed Surgeries / Procedures: Yes  Date of Onset of Symptoms: 04/11/2013  Treatments Tried: Hydrocortisone, eye lubricating drops, rubbing alcohol  Treatments Tried Worked: No  Guideline(s) Used:  Rash or Redness - Localized  Disposition Per Guideline:   See Today or Tomorrow in Office  Reason For Disposition Reached:   Pimples (localized) and no improvement after using Care Advice  Advice Given:  Avoid Scratching:  Try not to scratch. Cut your fingernails short.  Call Back If:  Rash spreads or becomes worse  Rash lasts longer than 1 week  You become worse.  Patient Will Follow Care Advice:  YES  Appointment Scheduled:  04/25/2013 16:00:00 Appointment Scheduled Provider:  Eleonore Chiquito (Family Practice > 34yrs old)

## 2013-05-19 ENCOUNTER — Encounter: Payer: Self-pay | Admitting: Internal Medicine

## 2013-05-19 ENCOUNTER — Ambulatory Visit (INDEPENDENT_AMBULATORY_CARE_PROVIDER_SITE_OTHER): Payer: Medicare Other | Admitting: Internal Medicine

## 2013-05-19 ENCOUNTER — Ambulatory Visit: Payer: Medicare Other | Admitting: Internal Medicine

## 2013-05-19 ENCOUNTER — Other Ambulatory Visit: Payer: Self-pay | Admitting: *Deleted

## 2013-05-19 VITALS — BP 136/80 | HR 76 | Temp 98.2°F | Resp 16 | Ht 61.5 in | Wt 174.0 lb

## 2013-05-19 DIAGNOSIS — J441 Chronic obstructive pulmonary disease with (acute) exacerbation: Secondary | ICD-10-CM

## 2013-05-19 DIAGNOSIS — F4323 Adjustment disorder with mixed anxiety and depressed mood: Secondary | ICD-10-CM

## 2013-05-19 DIAGNOSIS — L719 Rosacea, unspecified: Secondary | ICD-10-CM

## 2013-05-19 DIAGNOSIS — G4700 Insomnia, unspecified: Secondary | ICD-10-CM

## 2013-05-19 DIAGNOSIS — M810 Age-related osteoporosis without current pathological fracture: Secondary | ICD-10-CM

## 2013-05-19 MED ORDER — LORAZEPAM 1 MG PO TABS: ORAL_TABLET | ORAL | Status: DC

## 2013-05-19 MED ORDER — TRIAZOLAM 0.25 MG PO TABS: 0.2500 mg | ORAL_TABLET | Freq: Every evening | ORAL | Status: DC | PRN

## 2013-05-19 MED ORDER — BENEFIBER PLUS CALCIUM PO CHEW: 1.0000 | CHEWABLE_TABLET | Freq: Two times a day (BID) | ORAL | Status: DC

## 2013-05-19 MED ORDER — CLINDAMYCIN PHOSPHATE 1 % EX LOTN: TOPICAL_LOTION | Freq: Two times a day (BID) | CUTANEOUS | Status: DC

## 2013-05-19 NOTE — Patient Instructions (Addendum)
benifiber with calcium chews  Use the tudorza one time [facility] day

## 2013-05-19 NOTE — Progress Notes (Signed)
Subjective:    Patient ID: Sonya Franco, female    DOB: 11/15/1940, 72 y.o.   MRN: [id number]  HPI Has a spot on forehead that will not heal Has been gaining weight and  Thinks that she is not eating an more Insomnia and eating at night Constipation Has been using cortisone on face    Review of Systems  Constitutional: Positive for appetite change, fatigue and unexpected weight change. Negative for activity change.  HENT: Positive for postnasal drip. Negative for congestion, ear pain and sinus pressure.   Eyes: Negative for redness and visual disturbance.  Respiratory: Negative for cough, shortness of breath and wheezing.   Cardiovascular: Negative.   Gastrointestinal: Negative for abdominal pain and abdominal distention.  Genitourinary: Negative for dysuria, frequency and menstrual problem.  Musculoskeletal: Negative for arthralgias, joint swelling, myalgias and neck pain.  Skin: Positive for rash. Negative for wound.       Actinic keratosis  Neurological: Negative for dizziness, weakness and headaches.  Hematological: Negative for adenopathy. Does not bruise/bleed easily.  Psychiatric/Behavioral: Negative for sleep disturbance and decreased concentration.   Past Medical History  Diagnosis Date  . Postmenopausal HRT (hormone replacement therapy)   . Allergy   . GERD (gastroesophageal reflux disease)   . Arthritis   . Hemorrhoids     History   Social History  . Marital Status: Married    Spouse Name: N/A    Number of Children: N/A  . Years of Education: N/A   Occupational History  . retired    Social History Main Topics  . Smoking status: Current Every Day Smoker -- 0.30 packs/day    Types: Cigarettes  . Smokeless tobacco: Never Used  . Alcohol Use: No  . Drug Use: No  . Sexual Activity: Not Currently   Other Topics Concern  . Not on file   Social History Narrative  . No narrative on file    Past Surgical History  Procedure Laterality Date  .  Cesarean section    . Cesarean section      3 times  . Tubal ligation    . Tonsillectomy    . Appendectomy      Family History  Problem Relation Age of Onset  . Multiple sclerosis Mother   . Thyroid disease Mother   . Other Mother     MS  . Stroke Father   . Hypertension Father   . Heart disease Father     Allergies  Allergen Reactions  . Diclofenac Other (See Comments)    Tinnitus and upset stomach    Current Outpatient Prescriptions on File Prior to Visit  Medication Sig Dispense Refill  . cetirizine (ZYRTEC) 10 MG tablet Take 10 mg by mouth daily as needed.        Marland Kitchen estrogen, conjugated,-medroxyprogesterone (PREMPRO) 0.3-1.5 MG per tablet Take 1 tablet by mouth every other day.  30 tablet  11  . fluticasone (FLONASE) 50 MCG/ACT nasal spray Place 2 sprays into the nose daily.  16 g  6  . hydrocortisone 1 % ointment Apply topically 2 (two) times daily.  30 g  0  . indapamide (LOZOL) 1.25 MG tablet Take 0.5 tablets (0.625 mg total) by mouth every morning.  30 tablet  6  . LORazepam (ATIVAN) 1 MG tablet TAKE 1 TABLET EVERY 6 HOURS AS NEEDED FOR ANXIETY  120 tablet  3  . meloxicam (MOBIC) 15 MG tablet TAKE 1 TABLET (15 MG TOTAL) BY MOUTH DAILY.  30 tablet  6  . metroNIDAZOLE (METROGEL) 0.75 % gel Apply topically 2 (two) times daily. Use  as directed  45 g  1  . multivitamin (THERAGRAN) per tablet Take 1 tablet by mouth daily.        Marland Kitchen omeprazole (PRILOSEC) 40 MG capsule TAKE ONE CAPSULE EVERY DAY  30 capsule  5  . PROAIR HFA 108 (90 BASE) MCG/ACT inhaler INHALE 2 PUFFS INTO THE LUNGS 2 (TWO) TIMES DAILY.  8.5 each  6  . PROCTOSOL HC 2.5 % rectal cream APPLY TO RECTUM AS NEEDED FOR HEMORRHOIDS  28.35 g  4  . [DISCONTINUED] albuterol (PROVENTIL) 90 MCG/ACT inhaler Inhale 2 puffs into the lungs 2 (two) times daily.  17 g  12   No current facility-administered medications on file prior to visit.    BP 136/80  Pulse 76  Temp(Src) 98.2 F (36.8 C)  Resp 16  Ht 5' 1.5" (1.562  m)  Wt 174 lb (78.926 kg)  BMI 32.35 kg/m2        Objective:   Physical Exam  Nursing note and vitals reviewed. Constitutional: She is oriented to person, place, and time. She appears well-nourished.  HENT:  Head: Normocephalic and atraumatic.  Eyes: EOM are normal. Pupils are equal, round, and reactive to light.  Neck: Neck supple.  Pulmonary/Chest: Effort normal and breath sounds normal.  Neurological: She is alert and oriented to person, place, and time.  Skin: Skin is dry.  AK on face          Assessment & Plan:  Stay off the meloxicam Keep on Prilosec Trial of new sleep aid for insomnia and eating at night  Mild flair of COPD Trial of new inhaler tudoza  demonstrated use  AK on face And patient with a trial of cleocin gel to gel   Informed consent was obtained in the lesion was treated for 60 seconds of liquid nitrogen application the patient tolerated the procedure well as procedural care was discussed with the patient and instructions should the lesion reappears contact our office immediately

## 2013-05-19 NOTE — Progress Notes (Signed)
Pre-visit discussion using our clinic review tool. No additional management support is needed unless otherwise documented below in the visit note.  

## 2013-06-06 ENCOUNTER — Other Ambulatory Visit: Payer: Self-pay | Admitting: Internal Medicine

## 2013-06-15 ENCOUNTER — Other Ambulatory Visit: Payer: Self-pay | Admitting: *Deleted

## 2013-06-15 ENCOUNTER — Telehealth: Payer: Self-pay | Admitting: Internal Medicine

## 2013-06-15 DIAGNOSIS — G4700 Insomnia, unspecified: Secondary | ICD-10-CM

## 2013-06-15 MED ORDER — TRIAZOLAM 0.25 MG PO TABS: 0.2500 mg | ORAL_TABLET | Freq: Every evening | ORAL | Status: DC | PRN

## 2013-06-15 NOTE — Telephone Encounter (Signed)
Pt needs refill on triazolam [facility]

## 2013-07-26 ENCOUNTER — Encounter: Payer: Medicare Other | Admitting: Gynecology

## 2013-08-03 ENCOUNTER — Telehealth: Payer: Self-pay | Admitting: Internal Medicine

## 2013-08-03 ENCOUNTER — Other Ambulatory Visit: Payer: Self-pay | Admitting: *Deleted

## 2013-08-03 DIAGNOSIS — F4323 Adjustment disorder with mixed anxiety and depressed mood: Secondary | ICD-10-CM

## 2013-08-03 MED ORDER — LORAZEPAM 1 MG PO TABS: ORAL_TABLET | ORAL | Status: DC

## 2013-08-03 NOTE — Telephone Encounter (Signed)
Pt was given rx for lorazapam for 4 tab per day, insurance will only allow 3 tab please call [facility] rx [facility] [address]

## 2013-08-03 NOTE — Telephone Encounter (Signed)
done

## 2013-08-06 ENCOUNTER — Other Ambulatory Visit: Payer: Self-pay | Admitting: Internal Medicine

## 2013-08-07 ENCOUNTER — Other Ambulatory Visit: Payer: Self-pay | Admitting: Obstetrics and Gynecology

## 2013-09-27 ENCOUNTER — Telehealth: Payer: Self-pay | Admitting: Internal Medicine

## 2013-09-27 ENCOUNTER — Encounter: Payer: Self-pay | Admitting: *Deleted

## 2013-09-27 ENCOUNTER — Other Ambulatory Visit: Payer: Self-pay | Admitting: Internal Medicine

## 2013-09-27 ENCOUNTER — Ambulatory Visit (INDEPENDENT_AMBULATORY_CARE_PROVIDER_SITE_OTHER): Payer: Medicare Other | Admitting: Internal Medicine

## 2013-09-27 ENCOUNTER — Encounter: Payer: Self-pay | Admitting: Internal Medicine

## 2013-09-27 VITALS — BP 115/70 | HR 83 | Temp 98.0°F | Wt 170.0 lb

## 2013-09-27 DIAGNOSIS — J441 Chronic obstructive pulmonary disease with (acute) exacerbation: Secondary | ICD-10-CM

## 2013-09-27 DIAGNOSIS — J329 Chronic sinusitis, unspecified: Secondary | ICD-10-CM

## 2013-09-27 DIAGNOSIS — R252 Cramp and spasm: Secondary | ICD-10-CM

## 2013-09-27 DIAGNOSIS — D72829 Elevated white blood cell count, unspecified: Secondary | ICD-10-CM

## 2013-09-27 DIAGNOSIS — F4323 Adjustment disorder with mixed anxiety and depressed mood: Secondary | ICD-10-CM

## 2013-09-27 DIAGNOSIS — T887XXA Unspecified adverse effect of drug or medicament, initial encounter: Secondary | ICD-10-CM

## 2013-09-27 LAB — COMPREHENSIVE METABOLIC PANEL
ALT: 14 U/L (ref 0–35)
AST: 15 U/L (ref 0–37)
Albumin: 4.1 g/dL (ref 3.5–5.2)
Alkaline Phosphatase: 74 U/L (ref 39–117)
BUN: 12 mg/dL (ref 6–23)
CHLORIDE: 104 meq/L (ref 96–112)
CO2: 24 meq/L (ref 19–32)
CREATININE: 1 mg/dL (ref 0.4–1.2)
Calcium: 9 mg/dL (ref 8.4–10.5)
GFR: 59.57 mL/min — AB (ref >60.00)
Glucose, Bld: 79 mg/dL (ref 70–99)
Potassium: 3.6 mEq/L (ref 3.5–5.1)
Sodium: 137 mEq/L (ref 135–145)
Total Bilirubin: 0.2 mg/dL — ABNORMAL LOW (ref 0.3–1.2)
Total Protein: 7.6 g/dL (ref 6.0–8.3)

## 2013-09-27 LAB — CBC WITH DIFFERENTIAL/PLATELET
Basophils Absolute: 0 10*3/uL (ref 0.0–0.1)
Basophils Relative: 0.3 % (ref 0.0–3.0)
Eosinophils Absolute: 0.1 10*3/uL (ref 0.0–0.7)
Eosinophils Relative: 1 % (ref 0.0–5.0)
HCT: 43.5 % (ref 36.0–46.0)
Hemoglobin: 14.3 g/dL (ref 12.0–15.0)
LYMPHS PCT: 24 % (ref 12.0–46.0)
Lymphs Abs: 3.1 10*3/uL (ref 0.7–4.0)
MCHC: 32.8 g/dL (ref 30.0–36.0)
MCV: 92.6 fl (ref 78.0–100.0)
MONOS PCT: 6.3 % (ref 3.0–12.0)
Monocytes Absolute: 0.8 10*3/uL (ref 0.1–1.0)
Neutro Abs: 8.8 10*3/uL — ABNORMAL HIGH (ref 1.4–7.7)
Neutrophils Relative %: 68.4 % (ref 43.0–77.0)
PLATELETS: 358 10*3/uL (ref 150.0–400.0)
RBC: 4.7 Mil/uL (ref 3.87–5.11)
RDW: 14.6 % (ref 11.5–14.6)
WBC: 12.9 10*3/uL — ABNORMAL HIGH (ref 4.5–10.5)

## 2013-09-27 LAB — TSH: TSH: 1.16 u[IU]/mL (ref 0.35–5.50)

## 2013-09-27 MED ORDER — LORAZEPAM 1 MG PO TABS: ORAL_TABLET | ORAL | Status: DC

## 2013-09-27 MED ORDER — LEVOFLOXACIN 250 MG PO TABS: 500.0000 mg | ORAL_TABLET | Freq: Every day | ORAL | Status: DC

## 2013-09-27 MED ORDER — ACLIDINIUM BROMIDE 400 MCG/ACT IN AEPB: 1.0000 | INHALATION_SPRAY | Freq: Every day | RESPIRATORY_TRACT | Status: DC

## 2013-09-27 NOTE — Progress Notes (Signed)
Subjective:    Patient ID: Sonya Franco, female    DOB: 05/25/1941, 73 y.o.   MRN: [id number]  HPI  Swollen eyes, nose and nasal passages URI symptoms Has had episodes of muscle spasm/ tightness that is worse in the AM and improves as gets moving or taking baclofen.  COPD moderate SOB Hx of leukocytosis ( ?CLL)    Review of Systems  Constitutional: Positive for activity change and fatigue.  HENT: Positive for hearing loss, postnasal drip and rhinorrhea.   Respiratory: Positive for cough.   Endocrine: Negative.   Genitourinary: Negative.   Neurological: Positive for weakness.  Hematological: Bruises/bleeds easily.   Past Medical History  Diagnosis Date  . Postmenopausal HRT (hormone replacement therapy)   . Allergy   . GERD (gastroesophageal reflux disease)   . Arthritis   . Hemorrhoids     History   Social History  . Marital Status: Married    Spouse Name: N/A    Number of Children: N/A  . Years of Education: N/A   Occupational History  . retired    Social History Main Topics  . Smoking status: Current Every Day Smoker -- 0.30 packs/day    Types: Cigarettes  . Smokeless tobacco: Never Used  . Alcohol Use: No  . Drug Use: No  . Sexual Activity: Not Currently   Other Topics Concern  . Not on file   Social History Narrative  . No narrative on file    Past Surgical History  Procedure Laterality Date  . Cesarean section    . Cesarean section      3 times  . Tubal ligation    . Tonsillectomy    . Appendectomy      Family History  Problem Relation Age of Onset  . Multiple sclerosis Mother   . Thyroid disease Mother   . Other Mother     MS  . Stroke Father   . Hypertension Father   . Heart disease Father     Allergies  Allergen Reactions  . Diclofenac Other (See Comments)    Tinnitus and upset stomach    Current Outpatient Prescriptions on File Prior to Visit  Medication Sig Dispense Refill  . baclofen (LIORESAL) 10 MG tablet  TAKE 1 TABLET (10 MG TOTAL) BY MOUTH 3 (THREE) TIMES DAILY.  30 tablet  1  . cetirizine (ZYRTEC) 10 MG tablet Take 10 mg by mouth daily as needed.        . clindamycin (CLEOCIN-T) 1 % lotion Apply topically 2 (two) times daily.  60 mL  0  . estrogen, conjugated,-medroxyprogesterone (PREMPRO) 0.3-1.5 MG per tablet Take 1 tablet by mouth every other day.  30 tablet  11  . fluticasone (FLONASE) 50 MCG/ACT nasal spray PLACE 2 SPRAYS INTO THE NOSE DAILY.  16 g  3  . hydrocortisone 1 % ointment Apply topically 2 (two) times daily.  30 g  0  . indapamide (LOZOL) 1.25 MG tablet Take 0.5 tablets (0.625 mg total) by mouth every morning.  30 tablet  6  . LORazepam (ATIVAN) 1 MG tablet 1 mg 3 (three) times daily as needed.      Marland Kitchen. LORazepam (ATIVAN) 1 MG tablet TAKE 1 TABLET EVERY 6 HOURS AS NEEDED FOR ANXIETY  90 tablet  3  . meloxicam (MOBIC) 15 MG tablet TAKE 1 TABLET (15 MG TOTAL) BY MOUTH DAILY.  30 tablet  6  . metroNIDAZOLE (METROGEL) 0.75 % gel Apply topically 2 (two) times daily. Use  as directed  45 g  1  . multivitamin (THERAGRAN) per tablet Take 1 tablet by mouth daily.        Marland Kitchen omeprazole (PRILOSEC) 40 MG capsule TAKE ONE CAPSULE EVERY DAY  30 capsule  5  . PROAIR HFA 108 (90 BASE) MCG/ACT inhaler INHALE 2 PUFFS INTO THE LUNGS 2 (TWO) TIMES DAILY.  8.5 each  6  . PROCTOSOL HC 2.5 % rectal cream APPLY TO RECTUM AS NEEDED FOR HEMORRHOIDS  28.35 g  4  . triazolam (HALCION) 0.25 MG tablet Take 1 tablet (0.25 mg total) by mouth at bedtime as needed for sleep.  30 tablet  3  . Wheat Dextrin-Calcium (BENEFIBER PLUS CALCIUM) CHEW Chew 1 tablet by mouth 2 (two) times daily.      . [DISCONTINUED] albuterol (PROVENTIL) 90 MCG/ACT inhaler Inhale 2 puffs into the lungs 2 (two) times daily.  17 g  12   No current facility-administered medications on file prior to visit.    BP 115/70  Pulse 83  Temp(Src) 98 F (36.7 C)  Wt 170 lb (77.111 kg)  SpO2 99%       Objective:   Physical Exam    Constitutional: She is oriented to person, place, and time. She appears well-developed and well-nourished. No distress.  HENT:  Head: Normocephalic and atraumatic.  Eyes: Conjunctivae and EOM are normal. Pupils are equal, round, and reactive to light.  Neck: Normal range of motion. Neck supple. No JVD present. No tracheal deviation present. No thyromegaly present.  Cardiovascular: Normal rate and regular rhythm.   Murmur heard. Pulmonary/Chest: Effort normal. She has wheezes. She exhibits no tenderness.  Abdominal: Soft. Bowel sounds are normal.  Musculoskeletal: Normal range of motion. She exhibits no edema and no tenderness.  Lymphadenopathy:    She has no cervical adenopathy.  Neurological: She is alert and oriented to person, place, and time. She has normal reflexes. No cranial nerve deficit.  Skin: Skin is warm and dry. She is not diaphoretic.  Psychiatric: She has a normal mood and affect. Her behavior is normal.          Assessment & Plan:  COPD  Carlos American is the best medication for her COPD Chronic sinus infection 2 weeks of levofloxin Labs for fatigue and cramps  Worry about CLL conversion Stop the diuretics

## 2013-09-27 NOTE — Patient Instructions (Signed)
The patient is instructed to continue all medications as prescribed. Schedule followup with check out clerk upon leaving the clinic  

## 2013-09-27 NOTE — Telephone Encounter (Signed)
Pt request refill of baclofen (LIORESAL) 10 MG tablet   Pt seen today, forgot to ask. Also, does she need to dtay on the same mg?pt has 2 left and take /[facility] [address]

## 2013-09-27 NOTE — Progress Notes (Signed)
Pre visit review using our clinic review tool, if applicable. No additional management support is needed unless otherwise documented below in the visit note. 

## 2013-09-28 ENCOUNTER — Telehealth: Payer: Self-pay | Admitting: Internal Medicine

## 2013-09-28 MED ORDER — BACLOFEN 10 MG PO TABS: ORAL_TABLET | ORAL | Status: DC

## 2013-09-28 NOTE — Telephone Encounter (Signed)
Relevant patient education mailed to patient.  

## 2013-09-28 NOTE — Telephone Encounter (Signed)
rx sent in electronically 

## 2013-10-09 ENCOUNTER — Telehealth: Payer: Self-pay | Admitting: Internal Medicine

## 2013-10-09 NOTE — Telephone Encounter (Signed)
Pt was given 7 day antibiotic by Dr Lovell SheehanJenkins last week for chronic sinus inf. Pt states she is NOT better, feels terrible, eyes hurt above eyes and drainage. Pt would like another antibiotic to help get rid of this. Pt states she held 30 min on triage line. [facility] [address]

## 2013-10-11 MED ORDER — SULFAMETHOXAZOLE-TMP DS 800-160 MG PO TABS
1.0000 | ORAL_TABLET | Freq: Two times a day (BID) | ORAL | Status: DC
Start: 2013-10-11 — End: 2014-02-02

## 2013-10-11 NOTE — Telephone Encounter (Signed)
Septra DS number 20  BID for 10 day

## 2013-10-11 NOTE — Telephone Encounter (Signed)
Rx sent. Patient is aware.  

## 2013-10-13 ENCOUNTER — Other Ambulatory Visit: Payer: Self-pay | Admitting: Internal Medicine

## 2013-10-18 ENCOUNTER — Other Ambulatory Visit: Payer: Self-pay | Admitting: *Deleted

## 2013-10-18 ENCOUNTER — Telehealth: Payer: Self-pay | Admitting: Internal Medicine

## 2013-10-18 MED ORDER — TIOTROPIUM BROMIDE MONOHYDRATE 18 MCG IN CAPS: 18.0000 ug | ORAL_CAPSULE | Freq: Every day | RESPIRATORY_TRACT | Status: AC

## 2013-10-18 NOTE — Telephone Encounter (Signed)
[id number] [place], [place] - [address] sent fax stating triazolam (HALCION) 0.25 MG tablet is a NON-PREFERRED medication.  TRAZODONE IS PREFERRED. Please advise if a new RX will be sent in or if you want me to attempt PA.

## 2013-10-20 ENCOUNTER — Other Ambulatory Visit: Payer: Self-pay | Admitting: Internal Medicine

## 2013-10-20 MED ORDER — TRAZODONE 25 MG HALF TABLET: 25.0000 mg | ORAL_TABLET | Freq: Every day | ORAL | Status: DC

## 2013-10-20 NOTE — Telephone Encounter (Signed)
Try 25 mg of trazedone number 30 refill x 3

## 2013-10-20 NOTE — Telephone Encounter (Signed)
rx called in [facility] 

## 2013-10-27 ENCOUNTER — Other Ambulatory Visit: Payer: Self-pay

## 2013-10-27 MED ORDER — ZALEPLON 10 MG PO CAPS: 10.0000 mg | ORAL_CAPSULE | Freq: Every evening | ORAL | Status: DC | PRN

## 2013-10-27 NOTE — Telephone Encounter (Signed)
Triazolam rx dx, sonata 10 mg qhs prn sent into [facility]

## 2013-11-07 ENCOUNTER — Encounter: Payer: Self-pay | Admitting: Internal Medicine

## 2013-11-14 ENCOUNTER — Telehealth: Payer: Self-pay | Admitting: Internal Medicine

## 2013-11-14 NOTE — Telephone Encounter (Signed)
Pt has a sore in her nose and states dr Lovell Sheehanjenkins rx mupirocin (BACTROBAN) 2 % ointment Pt would like a refill. Pt's husband in rehab due to broken hip and takes she also takes care of grandchildren. Refused appt. No time or energy [facility] [address]

## 2013-11-15 MED ORDER — MUPIROCIN 2 % EX OINT
TOPICAL_OINTMENT | CUTANEOUS | Status: AC
Start: 2013-11-15 — End: ?

## 2013-11-15 NOTE — Telephone Encounter (Signed)
Ok per Dr Jenkins 

## 2013-11-15 NOTE — Telephone Encounter (Signed)
rx sent in electronically 

## 2013-11-16 ENCOUNTER — Other Ambulatory Visit: Payer: Self-pay | Admitting: Internal Medicine

## 2013-12-14 ENCOUNTER — Other Ambulatory Visit: Payer: Self-pay | Admitting: Internal Medicine

## 2014-01-24 ENCOUNTER — Other Ambulatory Visit: Payer: Self-pay | Admitting: Internal Medicine

## 2014-02-02 ENCOUNTER — Ambulatory Visit (INDEPENDENT_AMBULATORY_CARE_PROVIDER_SITE_OTHER): Payer: Medicare Other | Admitting: Family Medicine

## 2014-02-02 ENCOUNTER — Encounter: Payer: Self-pay | Admitting: Family Medicine

## 2014-02-02 VITALS — BP 120/72 | HR 90 | Temp 97.6°F | Ht 61.5 in | Wt 168.0 lb

## 2014-02-02 DIAGNOSIS — H9112 Presbycusis, left ear: Secondary | ICD-10-CM

## 2014-02-02 DIAGNOSIS — H9209 Otalgia, unspecified ear: Secondary | ICD-10-CM

## 2014-02-02 DIAGNOSIS — H9110 Presbycusis, unspecified ear: Secondary | ICD-10-CM

## 2014-02-02 DIAGNOSIS — H9202 Otalgia, left ear: Secondary | ICD-10-CM

## 2014-02-02 DIAGNOSIS — H9192 Unspecified hearing loss, left ear: Secondary | ICD-10-CM

## 2014-02-02 DIAGNOSIS — H9190 Unspecified hearing loss, unspecified ear: Secondary | ICD-10-CM

## 2014-02-02 MED ORDER — CIPROFLOXACIN-DEXAMETHASONE 0.3-0.1 % OT SUSP: 4.0000 [drp] | Freq: Two times a day (BID) | OTIC | Status: DC

## 2014-02-02 NOTE — Patient Instructions (Signed)
-  drops for 5 days twice daily in L ear  -schedule appointment with the ear doctor

## 2014-02-02 NOTE — Progress Notes (Signed)
No chief complaint on file.   HPI:  Acute visit for:  1) L ear stopped up: -reports: chronic ringing in ears - she reports told wax, hearing loss, some pressure and intermittent pain in L ear over 1 month -denies:fevers, nasal congestion, cough -reports treated with ear wax removal and flonase in the past -reports she has put several OTC ear cleaning drops in ears recently  ROS: See pertinent positives and negatives per HPI.  Past Medical History  Diagnosis Date  . Postmenopausal HRT (hormone replacement therapy)   . Allergy   . GERD (gastroesophageal reflux disease)   . Arthritis   . Hemorrhoids     Past Surgical History  Procedure Laterality Date  . Cesarean section    . Cesarean section      3 times  . Tubal ligation    . Tonsillectomy    . Appendectomy      Family History  Problem Relation Age of Onset  . Multiple sclerosis Mother   . Thyroid disease Mother   . Other Mother     MS  . Stroke Father   . Hypertension Father   . Heart disease Father     History   Social History  . Marital Status: Married    Spouse Name: N/A    Number of Children: N/A  . Years of Education: N/A   Occupational History  . retired    Social History Main Topics  . Smoking status: Current Every Day Smoker -- 0.30 packs/day    Types: Cigarettes  . Smokeless tobacco: Never Used  . Alcohol Use: No  . Drug Use: No  . Sexual Activity: Not Currently   Other Topics Concern  . None   Social History Narrative  . None    Current outpatient prescriptions:cetirizine (ZYRTEC) 10 MG tablet, Take 10 mg by mouth daily as needed.  , Disp: , Rfl: ;  clindamycin (CLEOCIN-T) 1 % lotion, Apply topically 2 (two) times daily., Disp: 60 mL, Rfl: 0;  estrogen, conjugated,-medroxyprogesterone (PREMPRO) 0.3-1.5 MG per tablet, Take 1 tablet by mouth every other day., Disp: 30 tablet, Rfl: 11 fluticasone (FLONASE) 50 MCG/ACT nasal spray, USE 2 SPRAYS IN EACH NOSTRIL ONCE A DAY, Disp: 16 g, Rfl:  3;  LORazepam (ATIVAN) 1 MG tablet, 1 mg 3 (three) times daily as needed., Disp: , Rfl: ;  mupirocin ointment (BACTROBAN) 2 %, APPLY TO SORE 3 TIMES A DAY, Disp: 22 g, Rfl: 0;  omeprazole (PRILOSEC) 40 MG capsule, TAKE ONE CAPSULE EVERY DAY *MYLAN ONLY*, Disp: 30 capsule, Rfl: 5 PROAIR HFA 108 (90 BASE) MCG/ACT inhaler, INHALE 2 PUFFS INTO THE LUNGS 2 (TWO) TIMES DAILY., Disp: 8.5 each, Rfl: 6;  tiotropium (SPIRIVA HANDIHALER) 18 MCG inhalation capsule, Place 1 capsule (18 mcg total) into inhaler and inhale daily., Disp: 30 capsule, Rfl: 11;  zaleplon (SONATA) 10 MG capsule, Take 1 capsule (10 mg total) by mouth at bedtime as needed for sleep., Disp: 30 capsule, Rfl: 3 ciprofloxacin-dexamethasone (CIPRODEX) otic suspension, Place 4 drops into the left ear 2 (two) times daily., Disp: 7.5 mL, Rfl: 0;  [DISCONTINUED] albuterol (PROVENTIL) 90 MCG/ACT inhaler, Inhale 2 puffs into the lungs 2 (two) times daily., Disp: 17 g, Rfl: 12  EXAM:  Filed Vitals:   02/02/14 1051  BP: 120/72  Pulse: 90  Temp: 97.6 F (36.4 C)    Body mass index is 31.23 kg/(m^2).  GENERAL: vitals reviewed and listed above, alert, oriented, appears well hydrated and in no acute distress  HEENT: atraumatic, conjunttiva clear, no obvious abnormalities on inspection of external nose and ears, L ear conal with small piece of soft wax versus dry skin, wet - likely from drops she used, portion of TM visualized normal  NECK: no obvious masses on inspection  LUNGS: clear to auscultation bilaterally, no wheezes, rales or rhonchi, good air movement  CV: HRRR, no peripheral edema  MS: moves all extremities without noticeable abnormality  PSYCH: pleasant and cooperative, no obvious depression or anxiety  ASSESSMENT AND PLAN:  Discussed the following assessment and plan:  Ear pain, left - Plan: ciprofloxacin-dexamethasone (CIPRODEX) otic suspension  Presbycusis, left  Hearing loss, left  -advised exam with audiology or ENT  for chronic hearing loss and tinnitus and intemrittent ear pain -she wanted me to attempt to remove small amount of soft wax/vs dry skin as convinced this is the issue -discussed risks and very gently attempted this with soft plastic curette in ext portion of canal - however with even the slightest touch of the ear canal she complained of pain so did not pursue this -flonase 21 day, afrin 3 days advised -no signs of infection but given pain ciprodex rxed and advised she see ENT - number provided to call -Patient advised to return or notify a doctor immediately if symptoms worsen or persist or new concerns arise.  Patient Instructions  -drops for 5 days twice daily in L ear  -schedule appointment with the ear doctor     Kriste BasqueKIM, HANNAH R.

## 2014-02-02 NOTE — Progress Notes (Signed)
Pre visit review using our clinic review tool, if applicable. No additional management support is needed unless otherwise documented below in the visit note. 

## 2014-02-10 ENCOUNTER — Other Ambulatory Visit: Payer: Self-pay | Admitting: Internal Medicine

## 2014-03-19 ENCOUNTER — Other Ambulatory Visit: Payer: Self-pay | Admitting: Internal Medicine

## 2014-04-13 ENCOUNTER — Other Ambulatory Visit: Payer: Self-pay | Admitting: Internal Medicine

## 2014-05-14 ENCOUNTER — Encounter: Payer: Self-pay | Admitting: Family Medicine

## 2014-05-25 ENCOUNTER — Telehealth: Payer: Self-pay

## 2014-05-25 NOTE — Telephone Encounter (Signed)
Pt is in need of an AWV.  

## 2014-05-28 ENCOUNTER — Emergency Department (HOSPITAL_COMMUNITY): Payer: Medicare Other

## 2014-05-28 ENCOUNTER — Emergency Department (HOSPITAL_COMMUNITY)
Admission: EM | Admit: 2014-05-28 | Discharge: 2014-05-28 | Disposition: A | Discharge status: Home / Self Care | Payer: Medicare Other | Attending: Emergency Medicine | Admitting: Emergency Medicine

## 2014-05-28 ENCOUNTER — Encounter (HOSPITAL_COMMUNITY): Payer: Self-pay | Admitting: Emergency Medicine

## 2014-05-28 DIAGNOSIS — Z720 Tobacco use: Secondary | ICD-10-CM | POA: Insufficient documentation

## 2014-05-28 DIAGNOSIS — Z79899 Other long term (current) drug therapy: Secondary | ICD-10-CM | POA: Diagnosis not present

## 2014-05-28 DIAGNOSIS — Z7951 Long term (current) use of inhaled steroids: Secondary | ICD-10-CM | POA: Insufficient documentation

## 2014-05-28 DIAGNOSIS — J8410 Pulmonary fibrosis, unspecified: Secondary | ICD-10-CM

## 2014-05-28 DIAGNOSIS — R0602 Shortness of breath: Secondary | ICD-10-CM

## 2014-05-28 DIAGNOSIS — M1990 Unspecified osteoarthritis, unspecified site: Secondary | ICD-10-CM | POA: Insufficient documentation

## 2014-05-28 DIAGNOSIS — Z8679 Personal history of other diseases of the circulatory system: Secondary | ICD-10-CM | POA: Insufficient documentation

## 2014-05-28 DIAGNOSIS — I4891 Unspecified atrial fibrillation: Secondary | ICD-10-CM | POA: Insufficient documentation

## 2014-05-28 DIAGNOSIS — K219 Gastro-esophageal reflux disease without esophagitis: Secondary | ICD-10-CM | POA: Insufficient documentation

## 2014-05-28 DIAGNOSIS — F10120 Alcohol abuse with intoxication, uncomplicated: Secondary | ICD-10-CM | POA: Diagnosis not present

## 2014-05-28 DIAGNOSIS — Z79890 Hormone replacement therapy (postmenopausal): Secondary | ICD-10-CM | POA: Diagnosis not present

## 2014-05-28 DIAGNOSIS — F10920 Alcohol use, unspecified with intoxication, uncomplicated: Secondary | ICD-10-CM

## 2014-05-28 DIAGNOSIS — R000 Tachycardia, unspecified: Secondary | ICD-10-CM

## 2014-05-28 LAB — RAPID URINE DRUG SCREEN, HOSP PERFORMED
Amphetamines: NOT DETECTED
BENZODIAZEPINES: NOT DETECTED
Barbiturates: NOT DETECTED
Cocaine: NOT DETECTED
OPIATES: NOT DETECTED
Tetrahydrocannabinol: NOT DETECTED

## 2014-05-28 LAB — CBC
HEMATOCRIT: 44.6 % (ref 36.0–46.0)
Hemoglobin: 14.7 g/dL (ref 12.0–15.0)
MCH: 31.1 pg (ref 26.0–34.0)
MCHC: 33 g/dL (ref 30.0–36.0)
MCV: 94.5 fL (ref 78.0–100.0)
Platelets: 326 10*3/uL (ref 150–400)
RBC: 4.72 MIL/uL (ref 3.87–5.11)
RDW: 14.7 % (ref 11.5–15.5)
WBC: 10.4 10*3/uL (ref 4.0–10.5)

## 2014-05-28 LAB — ACETAMINOPHEN LEVEL

## 2014-05-28 LAB — COMPREHENSIVE METABOLIC PANEL
ALK PHOS: 114 U/L (ref 39–117)
ALT: 28 U/L (ref 0–35)
ANION GAP: 18 — AB (ref 5–15)
AST: 24 U/L (ref 0–37)
Albumin: 3.6 g/dL (ref 3.5–5.2)
BUN: 6 mg/dL (ref 6–23)
CHLORIDE: 104 meq/L (ref 96–112)
CO2: 22 mEq/L (ref 19–32)
CREATININE: 0.7 mg/dL (ref 0.50–1.10)
Calcium: 9.1 mg/dL (ref 8.4–10.5)
GFR calc Af Amer: >90 mL/min (ref >90)
GFR calc non Af Amer: 83 mL/min — ABNORMAL LOW (ref >90)
Glucose, Bld: 95 mg/dL (ref 70–99)
POTASSIUM: 3.8 meq/L (ref 3.7–5.3)
Sodium: 144 mEq/L (ref 137–147)
TOTAL PROTEIN: 7.6 g/dL (ref 6.0–8.3)
Total Bilirubin: <0.2 mg/dL — ABNORMAL LOW (ref 0.3–1.2)

## 2014-05-28 LAB — ETHANOL: ALCOHOL ETHYL (B): 101 mg/dL — AB (ref 0–11)

## 2014-05-28 LAB — TSH: TSH: 2.34 u[IU]/mL (ref 0.350–4.500)

## 2014-05-28 LAB — SALICYLATE LEVEL: Salicylate Lvl: <2 mg/dL — ABNORMAL LOW (ref 2.8–20.0)

## 2014-05-28 LAB — I-STAT TROPONIN, ED: Troponin i, poc: 0.01 ng/mL (ref 0.00–0.08)

## 2014-05-28 MED ORDER — LORAZEPAM 2 MG/ML IJ SOLN
0.0000 mg | Freq: Four times a day (QID) | INTRAMUSCULAR | Status: DC
  Administered 2014-05-28: 2 mg via INTRAVENOUS

## 2014-05-28 MED ORDER — IOHEXOL 350 MG/ML SOLN
100.0000 mL | Freq: Once | INTRAVENOUS | Status: AC | PRN
  Administered 2014-05-28: 100 mL via INTRAVENOUS

## 2014-05-28 MED ORDER — VITAMIN B-1 100 MG PO TABS
100.0000 mg | ORAL_TABLET | Freq: Every day | ORAL | Status: DC
  Administered 2014-05-28: 100 mg via ORAL
  Filled 2014-05-28: qty 1

## 2014-05-28 MED ORDER — LORAZEPAM 2 MG/ML IJ SOLN
0.0000 mg | Freq: Two times a day (BID) | INTRAMUSCULAR | Status: DC
  Filled 2014-05-28: qty 1

## 2014-05-28 MED ORDER — LORAZEPAM 1 MG PO TABS: 0.0000 mg | ORAL_TABLET | Freq: Two times a day (BID) | ORAL | Status: DC

## 2014-05-28 MED ORDER — LORAZEPAM 1 MG PO TABS
0.0000 mg | ORAL_TABLET | Freq: Four times a day (QID) | ORAL | Status: DC
Start: 2014-05-28 — End: 2014-05-29

## 2014-05-28 MED ORDER — THIAMINE HCL 100 MG/ML IJ SOLN: 100.0000 mg | Freq: Every day | INTRAMUSCULAR | Status: DC

## 2014-05-28 NOTE — ED Notes (Signed)
Pt here for etoh detox but now is c/o SOB, pt states she has COPD and did not take nebulizer before she left home. States she feels like she needs it. Pt states she has a cigarette and 3 ounces of scotch before coming. Pt NAD.

## 2014-05-28 NOTE — ED Notes (Signed)
Pt states she would like to go home. MD aware. Pt advised to stay.

## 2014-05-28 NOTE — ED Notes (Signed)
MD at bedside. 

## 2014-05-28 NOTE — ED Notes (Signed)
Patient transported to X-ray 

## 2014-05-28 NOTE — ED Notes (Signed)
Made Mercedes PA aware of pt's HR ranging from 130s-150s with PVCs.  No new orders at this time.

## 2014-05-28 NOTE — ED Provider Notes (Signed)
CSN: [id number]     Arrival date & time 05/28/14  1604 History   First MD Initiated Contact with Patient 05/28/14 1747     Chief Complaint  Patient presents with  . etoh detox   . Shortness of Breath     (Consider location/radiation/quality/duration/timing/severity/associated sxs/prior Treatment) HPI Comments: Sonya Franco is a 73 y.o. female with a PMHx of GERD, arthritis, and hemorrhoids, and a PSHx of T&A, BTL, and 3 c-sections, who presents to the ED accompanied by her husband, complaining of alcohol intoxication/problem. Pt reports that she has been turning to [place] to help with her arthritis pain because she "refuses to take anything else" for it. She states she fell several weeks ago and this aggravated her pain. The pain is her normal arthritis pain in b/l knees and her lower back, but doesn't go into details of the pain. States for this reason, she took up drinking 1/2 a 750mL bottle of scotch daily. She had been sober for "many years" but "fell off" and started drinking approx a week ago, and has been heavily drinking for the last 2 days. Unclear on when her last drink was. She endorses a feeling of palpitations and intermittent SOB currently which had not been present previously. Denies fevers, chills, diaphoresis, URI symptoms, CP, wheezing, cough, hemoptysis, LE swelling, jaw/neck/back pain, abd pain, n/v/d/c, dysuria, hematuria, hematochezia, melena, myalgias, cauda equina symptoms, paresthesias, weakness, or syncope. States she's no longer taking her estrogen therapy, and denies hx of cancer. Previously saw Dr. Darryll CapersJohn Jenkins for her care but she no longer sees him and has no PCP at this time.   Patient is a 73 y.o. female presenting with intoxication. The history is provided by the patient and the spouse. No language interpreter was used.  Alcohol Intoxication This is a recurrent problem. The current episode started in the past 7 days. The problem occurs constantly. The problem  has been unchanged. Associated symptoms include arthralgias (chronic arthritis pain in b/l knees and low back). Pertinent negatives include no abdominal pain, change in bowel habit, chest pain (none today, but has had some in the past), chills, congestion, coughing, diaphoresis, fever, headaches, joint swelling, myalgias, nausea, neck pain, numbness, urinary symptoms, vomiting or weakness. Nothing aggravates the symptoms. She has tried nothing for the symptoms. The treatment provided no relief.    Past Medical History  Diagnosis Date  . Postmenopausal HRT (hormone replacement therapy)   . Allergy   . GERD (gastroesophageal reflux disease)   . Arthritis   . Hemorrhoids    Past Surgical History  Procedure Laterality Date  . Cesarean section    . Cesarean section      3 times  . Tubal ligation    . Tonsillectomy    . Appendectomy     Family History  Problem Relation Age of Onset  . Multiple sclerosis Mother   . Thyroid disease Mother   . Other Mother     MS  . Stroke Father   . Hypertension Father   . Heart disease Father    History  Substance Use Topics  . Smoking status: Current Every Day Smoker -- 0.30 packs/day    Types: Cigarettes  . Smokeless tobacco: Never Used  . Alcohol Use: No   OB History    Gravida Para Term Preterm AB TAB SAB Ectopic Multiple Living   4 4 3 1      3      Review of Systems  Constitutional: Negative for fever, chills  and diaphoresis.  HENT: Negative for congestion, rhinorrhea and sinus pressure.   Respiratory: Positive for shortness of breath. Negative for cough, chest tightness and wheezing.   Cardiovascular: Positive for palpitations. Negative for chest pain (none today, but has had some in the past) and leg swelling.  Gastrointestinal: Negative for nausea, vomiting, abdominal pain, diarrhea, constipation, blood in stool, abdominal distention and change in bowel habit.  Genitourinary: Negative for dysuria and hematuria.  Musculoskeletal:  Positive for back pain (lower back) and arthralgias (chronic arthritis pain in b/l knees and low back). Negative for myalgias, joint swelling and neck pain.  Skin: Negative for color change.  Neurological: Negative for dizziness, tremors, seizures, syncope, weakness, light-headedness, numbness and headaches.  Hematological: Does not bruise/bleed easily.  Psychiatric/Behavioral: Negative for confusion.   10 Systems reviewed and are negative for acute change except as noted in the HPI.    Allergies  Diclofenac  Home Medications   Prior to Admission medications   Medication Sig Start Date End Date Taking? Authorizing Provider  cetirizine (ZYRTEC) 10 MG tablet Take 10 mg by mouth daily.    Yes Historical Provider, MD  fluticasone (FLONASE) 50 MCG/ACT nasal spray USE 2 SPRAYS IN EACH NOSTRIL ONCE A DAY 01/24/14  Yes Stacie Glaze, MD  LORazepam (ATIVAN) 1 MG tablet Take 1 mg by mouth 3 (three) times daily as needed for anxiety (usually in the evening).  05/19/13  Yes Stacie Glaze, MD  omeprazole (PRILOSEC) 40 MG capsule TAKE ONE CAPSULE BY MOUTH EVERY DAY (*MYLAN BRAND ONLY*) 04/13/14  Yes Stacie Glaze, MD  PROAIR HFA 108 (90 BASE) MCG/ACT inhaler INHALE 2 PUFFS INTO THE LUNGS 2 (TWO) TIMES DAILY.   Yes Stacie Glaze, MD  traZODone (DESYREL) 50 MG tablet Take 25 mg by mouth at bedtime.   Yes Historical Provider, MD  ciprofloxacin-dexamethasone (CIPRODEX) otic suspension Place 4 drops into the left ear 2 (two) times daily. Patient not taking: Reported on 05/28/2014 02/02/14   Terressa Koyanagi, DO  clindamycin (CLEOCIN-T) 1 % lotion Apply topically 2 (two) times daily. Patient not taking: Reported on 05/28/2014 05/19/13   Stacie Glaze, MD  estrogen, conjugated,-medroxyprogesterone (PREMPRO) 0.3-1.5 MG per tablet Take 1 tablet by mouth every other day. Patient not taking: Reported on 05/28/2014 07/26/12   Dara Lords, MD  mupirocin ointment (BACTROBAN) 2 % APPLY TO SORE 3 TIMES A  DAY Patient not taking: Reported on 05/28/2014 11/15/13   Stacie Glaze, MD  tiotropium (SPIRIVA HANDIHALER) 18 MCG inhalation capsule Place 1 capsule (18 mcg total) into inhaler and inhale daily. Patient not taking: Reported on 05/28/2014 10/18/13   Stacie Glaze, MD  zaleplon (SONATA) 10 MG capsule Take 1 capsule (10 mg total) by mouth at bedtime as needed for sleep. Patient not taking: Reported on 05/28/2014 10/27/13   Stacie Glaze, MD   BP 159/85 mmHg  Pulse 138  Temp(Src) 97.9 F (36.6 C) (Oral)  Resp 22  SpO2 98% Physical Exam  Constitutional: She is oriented to person, place, and time. She appears well-developed and well-nourished.  Non-toxic appearance. No distress.  Afebrile, nontoxic, but tachycardic. Mildly intoxicated  HENT:  Head: Normocephalic and atraumatic.  Mouth/Throat: Oropharynx is clear and moist and mucous membranes are normal.  Eyes: Conjunctivae and EOM are normal. Pupils are equal, round, and reactive to light. Right eye exhibits no discharge. Left eye exhibits no discharge.  Neck: Normal range of motion. Neck supple.  Cardiovascular: Normal heart sounds and intact  distal pulses.  An irregularly irregular rhythm present. Tachycardia present.  Exam reveals no gallop and no friction rub.   No murmur heard. Tachycardic and irregularly irregular rhythm, distal pulses intact, no pedal edema, no m/r/g  Pulmonary/Chest: Effort normal. No respiratory distress. She has decreased breath sounds in the left lower field. She has no wheezes. She has no rhonchi. She has no rales.  Slightly diminished LLL breath sounds without wheezing or rhonchi or rales but pt with poor inspiratory effort. SpO2 100% on RA during exam.   Abdominal: Soft. Normal appearance and bowel sounds are normal. She exhibits no distension. There is no tenderness. There is no rigidity, no rebound, no guarding, no tenderness at McBurney's point and negative Murphy's sign.  Musculoskeletal: Normal range of  motion.  MAE x4 No focal TTP over knees or lower back. No deformity or crepitus. Strength and sensation at baseline, ambulatory without issue  Neurological: She is alert and oriented to person, place, and time. She has normal strength. No sensory deficit.  A&O x3 although slightly intoxicated appearing  Skin: Skin is warm, dry and intact. No rash noted.  Psychiatric: She has a normal mood and affect.  Nursing note and vitals reviewed.   ED Course  Procedures (including critical care time) Labs Review Labs Reviewed  COMPREHENSIVE METABOLIC PANEL - Abnormal; Notable for the following:    Total Bilirubin <0.2 (*)    GFR calc non Af Amer 83 (*)    Anion gap 18 (*)    All other components within normal limits  ETHANOL - Abnormal; Notable for the following:    Alcohol, Ethyl (B) 101 (*)    All other components within normal limits  SALICYLATE LEVEL - Abnormal; Notable for the following:    Salicylate Lvl <2.0 (*)    All other components within normal limits  ACETAMINOPHEN LEVEL  CBC  URINE RAPID DRUG SCREEN (HOSP PERFORMED)  TSH  I-STAT TROPOININ, ED    Imaging Review Dg Chest 2 View  05/28/2014   CLINICAL DATA:  Shortness of breath and severely tachycardic. Alcohol detoxification.  EXAM: CHEST  2 VIEW  COMPARISON:  Chest x-ray 12/11/2010.  FINDINGS: Airspace consolidation in the periphery of the left lower lobe. Possible small left pleural effusion. Right lung is clear. No right pleural effusion. No evidence of pulmonary edema. Heart size is normal. Mediastinal contours are within normal limits.  IMPRESSION: 1. Small area of consolidation in the periphery of the left lower lobe with small left-sided pleural effusion. Findings are concerning for developing pneumonia, but could be seen in the setting of other acute processes such as acute pulmonary embolism. Clinical correlation is recommended.   Electronically Signed   By: Trudie Reed M.D.   On: 05/28/2014 19:28   Ct Angio Chest  Pe W/cm &/or Wo Cm  05/28/2014   CLINICAL DATA:  Shortness of breath, tachycardia, history COPD, GERD  EXAM: CT ANGIOGRAPHY CHEST WITH CONTRAST  TECHNIQUE: Multidetector CT imaging of the chest was performed using the standard protocol during bolus administration of intravenous contrast. Multiplanar CT image reconstructions and MIPs were obtained to evaluate the vascular anatomy.  CONTRAST:  OMNIPAQUE IOHEXOL 350 MG/ML SOLN IV  COMPARISON:  None  FINDINGS: Aorta normal caliber without aneurysm or dissection.  Small amount thrombus within descending thoracic aorta.  Scattered atherosclerotic calcifications aorta and coronary arteries.  Calcified mediastinal lymph nodes.  Visualized upper abdomen unremarkable.  Small hiatal hernia.  Pulmonary arteries well opacified and patent.  No evidence of  pulmonary embolism.  Large calcified granuloma LEFT lower lobe.  Minimal atelectasis dependently in RIGHT lower lobe.  Lungs otherwise clear.  No pulmonary infiltrate, pleural effusion, or pneumothorax.  Degenerative disc disease changes lower thoracic spine.  Review of the MIP images confirms the above findings.  IMPRESSION: No evidence of pulmonary embolism.  Scattered atherosclerotic disease changes.   Electronically Signed   By: Ulyses Southward M.D.   On: 05/28/2014 21:24     EKG Interpretation   Date/Time:  Monday May 28 2014 18:34:55 EST Ventricular Rate:  126 PR Interval:    QRS Duration: 82 QT Interval:  337 QTC Calculation: 488 R Axis:   -4 Text Interpretation:  Age not entered, assumed to be  73 years old for  purpose of ECG interpretation Atrial fibrillation ST depr, consider  ischemia, inferior leads Borderline prolonged QT interval No old tracing  to compare Confirmed by Mirian Mo [phone]) on 05/28/2014 7:15:28 PM      MDM   Final diagnoses:  SOB (shortness of breath)  Tachycardia  Atrial fibrillation, currently in sinus rhythm  Alcohol intoxication, uncomplicated  Lung  granuloma    73y/o female with EtOH detox but also complaints of palpitations and SOB, has had some remote CP before but none at this time. Tachycardic on exam concerning for afib, pt has no hx of this. CBC WNL, CMP WNL. EtOH 101, ASA level WNL. UDS unremarkable. Will obtain EKG, CXR, trop, and TSH and eval need for antiarrhythmics. Tachycardia could be related to detox, will start CIWA protocol and see if ativan helps improve her sxs. Discussed case with Dr. Littie Deeds who will see the pt.  7:45 PM Ativan given and pt did not have any improvement in symptoms. Dr. Littie Deeds in to see pt. EKG showing afib. Trop neg. TSH pending. CXR revealed LLL consolidation with pleural effusion concerning for PNA vs PE. Ordered CTA. Pt does not want to stay but agreed to proceed with CTA to r/o PE. Will hold on starting cardizem at this time.  8:31 PM Dr. Littie Deeds informing me that pt agrees to admission. CTA pending. HR improved now, will continue to hold on any antiarrhythmics.  10:07 PM TSH WNL. CTA without evidence of PE. Shows scattered atherosclerotic changes and mediastinal lymph nodes as well as large calcified LLL granuloma. No infiltrates or effusion. Pt appears improved, sitting at bedside, calm and in no distress, cooperative and without s/sx of intoxication or withdrawal.  10:49 PM Dr. Zachery Conch of cardiology returning page. Recommends EtOH control, and could consider starting metoprolol 12.5 mg BID, with PCP f/up for now since she wouldn't be a candidate for cardiology standpoint until she's clean from alcohol. Pt declines wanting to start any meds since she has COPD and there is a risk with beta blockers. Will have her see her PCP. Pt motivated to stop drinking alcohol, and I stressed the importance of this. I explained the diagnosis and have given explicit precautions to return to the ER including for any other new or worsening symptoms. The patient understands and accepts the medical plan as it's been  dictated and I have answered their questions. Discharge instructions concerning home care and prescriptions have been given. The patient is STABLE and is discharged to home in good condition.  BP 157/83 mmHg  Pulse 80  Temp(Src) 98.2 F (36.8 C) (Oral)  Resp 16  SpO2 98%  Meds ordered this encounter  Medications  . LORazepam (ATIVAN) tablet 0-4 mg    Sig:  Order Specific Question:  CIWA-AR < 5 =    Answer:  0 mg    Order Specific Question:  CIWA-AR 5 -10 =    Answer:  1 mg    Order Specific Question:  CIWA-AR 11 -15 =    Answer:  2 mg    Order Specific Question:  CIWA-AR 16 -24 =    Answer:  2 mg    Order Specific Question:  CIWA-AR 16 -24 =    Answer:  Recheck CIWA-AR in 1 hour; if > 15 notify MD    Order Specific Question:  CIWA-AR > 24 =    Answer:  4 mg    Order Specific Question:  CIWA-AR > 24 =    Answer:  Call Rapid Response  . LORazepam (ATIVAN) tablet 0-4 mg    Sig:     Order Specific Question:  CIWA-AR < 5 =    Answer:  0 mg    Order Specific Question:  CIWA-AR 5 -10 =    Answer:  1 mg    Order Specific Question:  CIWA-AR 11 -15 =    Answer:  2 mg    Order Specific Question:  CIWA-AR 16 -24 =    Answer:  2 mg    Order Specific Question:  CIWA-AR 16 -24 =    Answer:  Recheck CIWA-AR in 1 hour; if > 15 notify MD    Order Specific Question:  CIWA-AR > 24 =    Answer:  4 mg    Order Specific Question:  CIWA-AR > 24 =    Answer:  Call Rapid Response  . LORazepam (ATIVAN) injection 0-4 mg    Sig:     Order Specific Question:  CIWA-AR < 5 =    Answer:  0 mg    Order Specific Question:  CIWA-AR 5 -10 =    Answer:  1 mg    Order Specific Question:  CIWA-AR 11 -15 =    Answer:  2 mg    Order Specific Question:  CIWA-AR 16 -24 =    Answer:  2 mg    Order Specific Question:  CIWA-AR 16 -24 =    Answer:  Recheck CIWA-AR in 1 hour; if > 15 notify MD    Order Specific Question:  CIWA-AR > 24 =    Answer:  4 mg    Order Specific Question:  CIWA-AR > 24 =     Answer:  Call Rapid Response  . LORazepam (ATIVAN) injection 0-4 mg    Sig:     Order Specific Question:  CIWA-AR < 5 =    Answer:  0 mg    Order Specific Question:  CIWA-AR 5 -10 =    Answer:  1 mg    Order Specific Question:  CIWA-AR 11 -15 =    Answer:  2 mg    Order Specific Question:  CIWA-AR 16 -24 =    Answer:  2 mg    Order Specific Question:  CIWA-AR 16 -24 =    Answer:  Recheck CIWA-AR in 1 hour; if > 15 notify MD    Order Specific Question:  CIWA-AR > 24 =    Answer:  4 mg    Order Specific Question:  CIWA-AR > 24 =    Answer:  Call Rapid Response  . thiamine (VITAMIN B-1) tablet 100 mg    Sig:   . thiamine (B-1) injection 100 mg    Sig:   .  iohexol (OMNIPAQUE) 350 MG/ML injection 100 mL    Sig:      Donnita FallsMercedes Strupp Leslieamprubi-Soms, PA-C 05/28/14 2311  Mirian MoMatthew Gentry, MD 05/30/14 [phone]

## 2014-05-28 NOTE — ED Notes (Signed)
Bed: [id number] Expected date:  Expected time:  Means of arrival:  Comments: Tri 4

## 2014-05-28 NOTE — ED Notes (Signed)
Instructed xray tech to come back in 15 minutes to take pt after ativan had begun kicking in. Gave pt medication. Pt heart rate and anxiety were not affected, HR still jumping up to 150-160. Pt began demanding to go use the restroom after being placed on a bedpan 20 minutes prior. Pt began to attempt to crawl out of the bed. Got pt up into a wheelchair and another RN escorted her to the restroom. Husband left for dinner - Bill - # [phone]

## 2014-05-28 NOTE — Discharge Instructions (Signed)
It's very important for you to stop drinking. This should help prevent you from having atrial fibrillation again, and will help you avoid any adverse events related to being intoxicated or having atrial fibrillation. See your regular doctor as soon as possible for ongoing management and care. Return to the ER for changes or worsening of symptoms.   Alcohol Intoxication Alcohol intoxication occurs when you drink enough alcohol that it affects your ability to function. It can be mild or very severe. Drinking a lot of alcohol in a short time is called binge drinking. This can be very harmful. Drinking alcohol can also be more dangerous if you are taking medicines or other drugs. Some of the effects caused by alcohol may include:  Loss of coordination.  Changes in mood and behavior.  Unclear thinking.  Trouble talking (slurred speech).  Throwing up (vomiting).  Confusion.  Slowed breathing.  Twitching and shaking (seizures).  Loss of consciousness. HOME CARE  Do not drive after drinking alcohol.  Drink enough water and fluids to keep your pee (urine) clear or pale yellow. Avoid caffeine.  Only take medicine as told by your doctor. GET HELP IF:  You throw up (vomit) many times.  You do not feel better after a few days.  You frequently have alcohol intoxication. Your doctor can help decide if you should see a substance use treatment counselor. GET HELP RIGHT AWAY IF:  You become shaky when you stop drinking.  You have twitching and shaking.  You throw up blood. It may look bright red or like coffee grounds.  You notice blood in your poop (bowel movements).  You become lightheaded or pass out (faint). MAKE SURE YOU:   Understand these instructions.  Will watch your condition.  Will get help right away if you are not doing well or get worse. Document Released: 12/16/2007 Document Revised: 03/01/2013 Document Reviewed: 12/02/2012 [facility] Patient Information 2015  [place], [place]. This information is not intended to replace advice given to you by your health care provider. Make sure you discuss any questions you have with your health care provider.  Atrial Fibrillation Atrial fibrillation is a type of irregular heart rhythm (arrhythmia). During atrial fibrillation, the upper chambers of the heart (atria) quiver continuously in a chaotic pattern. This causes an irregular and often rapid heart rate.  Atrial fibrillation is the result of the heart becoming overloaded with disorganized signals that tell it to beat. These signals are normally released one at a time by a part of the right atrium called the sinoatrial node. They then travel from the atria to the lower chambers of the heart (ventricles), causing the atria and ventricles to contract and pump blood as they pass. In atrial fibrillation, parts of the atria outside of the sinoatrial node also release these signals. This results in two problems. First, the atria receive so many signals that they do not have time to fully contract. Second, the ventricles, which can only receive one signal at a time, beat irregularly and out of rhythm with the atria.  There are three types of atrial fibrillation:   Paroxysmal. Paroxysmal atrial fibrillation starts suddenly and stops on its own within a week.  Persistent. Persistent atrial fibrillation lasts for more than a week. It may stop on its own or with treatment.  Permanent. Permanent atrial fibrillation does not go away. Episodes of atrial fibrillation may lead to permanent atrial fibrillation. Atrial fibrillation can prevent your heart from pumping blood normally. It increases your risk of stroke  and can lead to heart failure.  CAUSES   Heart conditions, including a heart attack, heart failure, coronary artery disease, and heart valve conditions.   Inflammation of the sac that surrounds the heart (pericarditis).  Blockage of an artery in the lungs (pulmonary  embolism).  Pneumonia or other infections.  Chronic lung disease.  Thyroid problems, especially if the thyroid is overactive (hyperthyroidism).  Caffeine, excessive alcohol use, and use of some illegal drugs.   Use of some medicines, including certain decongestants and diet pills.  Heart surgery.   Birth defects.  Sometimes, no cause can be found. When this happens, the atrial fibrillation is called lone atrial fibrillation. The risk of complications from atrial fibrillation increases if you have lone atrial fibrillation and you are age 75 years or older. RISK FACTORS  Heart failure.  Coronary artery disease.  Diabetes mellitus.   High blood pressure (hypertension).   Obesity.   Other arrhythmias.   Increased age. SIGNS AND SYMPTOMS   A feeling that your heart is beating rapidly or irregularly.   A feeling of discomfort or pain in your chest.   Shortness of breath.   Sudden light-headedness or weakness.   Getting tired easily when exercising.   Urinating more often than normal (mainly when atrial fibrillation first begins).  In paroxysmal atrial fibrillation, symptoms may start and suddenly stop. DIAGNOSIS  Your health care provider may be able to detect atrial fibrillation when taking your pulse. Your health care provider may have you take a test called an ambulatory electrocardiogram (ECG). An ECG records your heartbeat patterns over a 24-hour period. You may also have other tests, such as:  Transthoracic echocardiogram (TTE). During echocardiography, sound waves are used to evaluate how blood flows through your heart.  Transesophageal echocardiogram (TEE).  Stress test. There is more than one type of stress test. If a stress test is needed, ask your health care provider about which type is best for you.  Chest X-ray exam.  Blood tests.  Computed tomography (CT). TREATMENT  Treatment may include:  Treating any underlying conditions. For  example, if you have an overactive thyroid, treating the condition may correct atrial fibrillation.  Taking medicine. Medicines may be given to control a rapid heart rate or to prevent blood clots, heart failure, or a stroke.  Having a procedure to correct the rhythm of the heart:  Electrical cardioversion. During electrical cardioversion, a controlled, low-energy shock is delivered to the heart through your skin. If you have chest pain, very low blood pressure, or sudden heart failure, this procedure may need to be done as an emergency.  Catheter ablation. During this procedure, heart tissues that send the signals that cause atrial fibrillation are destroyed.  Surgical ablation. During this surgery, thin lines of heart tissue that carry the abnormal signals are destroyed. This procedure can either be an open-heart surgery or a minimally invasive surgery. With the minimally invasive surgery, small cuts are made to access the heart instead of a large opening.  Pulmonary venous isolation. During this surgery, tissue around the veins that carry blood from the lungs (pulmonary veins) is destroyed. This tissue is thought to carry the abnormal signals. HOME CARE INSTRUCTIONS   Take medicines only as directed by your health care provider. Some medicines can make atrial fibrillation worse or recur.  If blood thinners were prescribed by your health care provider, take them exactly as directed. Too much blood-thinning medicine can cause bleeding. If you take too little, you will not have  the needed protection against stroke and other problems.  Perform blood tests at home if directed by your health care provider. Perform blood tests exactly as directed.  Quit smoking if you smoke.  Do not drink alcohol.  Do not drink caffeinated beverages such as coffee, soda, and some teas. You may drink decaffeinated coffee, soda, or tea.   Maintain a healthy weight.Do not use diet pills unless your health care  provider approves. They may make heart problems worse.   Follow diet instructions as directed by your health care provider.  Exercise regularly as directed by your health care provider.  Keep all follow-up visits as directed by your health care provider. This is important. PREVENTION  The following substances can cause atrial fibrillation to recur:   Caffeinated beverages.  Alcohol.  Certain medicines, especially those used for breathing problems.  Certain herbs and herbal medicines, such as those containing ephedra or ginseng.  Illegal drugs, such as cocaine and amphetamines. Sometimes medicines are given to prevent atrial fibrillation from recurring. Proper treatment of any underlying condition is also important in helping prevent recurrence.  SEEK MEDICAL CARE IF:  You notice a change in the rate, rhythm, or strength of your heartbeat.  You suddenly begin urinating more frequently.  You tire more easily when exerting yourself or exercising. SEEK IMMEDIATE MEDICAL CARE IF:   You have chest pain, abdominal pain, sweating, or weakness.  You feel nauseous.  You have shortness of breath.  You suddenly have swollen feet and ankles.  You feel dizzy.  Your face or limbs feel numb or weak.  You have a change in your vision or speech. MAKE SURE YOU:   Understand these instructions.  Will watch your condition.  Will get help right away if you are not doing well or get worse. Document Released: 06/29/2005 Document Revised: 11/13/2013 Document Reviewed: 08/09/2012 [facility] Patient Information 2015 [place], [place]. This information is not intended to replace advice given to you by your health care provider. Make sure you discuss any questions you have with your health care provider.  How Much is Too Much Alcohol? Drinking too much alcohol can cause injury, accidents, and health problems. These types of problems can include:   Car crashes.  Falls.  Family fighting  (domestic violence).  Drowning.  Fights.  Injuries.  Burns.  Damage to certain organs.  Having a baby with birth defects. ONE DRINK CAN BE TOO MUCH WHEN YOU ARE:  Working.  Pregnant or breastfeeding.  Taking medicines. Ask your doctor.  Driving or planning to drive. WHAT IS A STANDARD DRINK?   1 regular beer (12 ounces or 360 milliliters).  1 glass of wine (5 ounces or 150 milliliters).  1 shot of liquor (1.5 ounces or 45 milliliters). BLOOD ALCOHOL LEVELS   .00 A person is sober.  Marland Kitchen03 A person has no trouble keeping balance, talking, or seeing right, but a "buzz" may be felt.  Marland Kitchen05 A person feels "buzzed" and relaxed.  Marland Kitchen08 or .10  A person is drunk. He or she has trouble talking, seeing right, and keeping his or her balance.  .15 A person loses body control and may pass out (blackout).  .20 A person has trouble walking (staggering) and throws up (vomits).  .30 A person will pass out (unconscious).  .40+ A person will be in a coma. Death is possible. If you or someone you know has a drinking problem, get help from a doctor.  Document Released: 04/25/2009 Document Revised: 09/21/2011 Document Reviewed:  04/25/2009 [employer] Patient Information 2015 [place]. This information is not intended to replace advice given to you by your health care provider. Make sure you discuss any questions you have with your health care provider.

## 2014-05-29 ENCOUNTER — Ambulatory Visit: Payer: Medicare Other | Admitting: Gastroenterology

## 2014-05-29 ENCOUNTER — Telehealth: Payer: Self-pay

## 2014-05-29 NOTE — Telephone Encounter (Signed)
Pt is the wife of a patient of Dr Christella HartiganJacobs, he would like to help get the pt established with PCP.  1030 am 06/01/14 Dr Verlin FesterKollar [facility] Pt's husband is aware.

## 2014-06-01 ENCOUNTER — Ambulatory Visit (INDEPENDENT_AMBULATORY_CARE_PROVIDER_SITE_OTHER): Payer: Medicare Other | Admitting: Internal Medicine

## 2014-06-01 ENCOUNTER — Encounter: Payer: Self-pay | Admitting: Internal Medicine

## 2014-06-01 VITALS — BP 136/76 | HR 83 | Temp 97.9°F | Resp 12 | Ht 61.0 in | Wt 173.8 lb

## 2014-06-01 DIAGNOSIS — I480 Paroxysmal atrial fibrillation: Secondary | ICD-10-CM

## 2014-06-01 DIAGNOSIS — M130 Polyarthritis, unspecified: Secondary | ICD-10-CM

## 2014-06-01 DIAGNOSIS — F1010 Alcohol abuse, uncomplicated: Secondary | ICD-10-CM

## 2014-06-01 DIAGNOSIS — G4700 Insomnia, unspecified: Secondary | ICD-10-CM

## 2014-06-01 DIAGNOSIS — J449 Chronic obstructive pulmonary disease, unspecified: Secondary | ICD-10-CM

## 2014-06-01 MED ORDER — TRAZODONE HCL 50 MG PO TABS: 50.0000 mg | ORAL_TABLET | Freq: Every day | ORAL | Status: DC

## 2014-06-01 MED ORDER — LORAZEPAM 1 MG PO TABS: 1.0000 mg | ORAL_TABLET | Freq: Two times a day (BID) | ORAL | Status: DC | PRN

## 2014-06-01 NOTE — Progress Notes (Signed)
Pre visit review using our clinic review tool, if applicable. No additional management support is needed unless otherwise documented below in the visit note. 

## 2014-06-01 NOTE — Patient Instructions (Addendum)
We will end up changing your sleeping medicines a little bit. You should not be taking the lorazepam 3 times at night. What we will do is have you go down to taking it twice at night starting today you can do this for 2 weeks. After the 2 weeks we want to to go down to taking it only once at night for the next month. We would like you to increase your trazodone to taking one whole pill at nighttime.   We think it would be a good idea to stop drinking alcohol again. Alcohol in any amount can cause atrial fibrillation which is that funny rhythm that your heart was in on Monday. When you have that funny heart rhythm you are more likely to get strokes, heart problems. Alcohol can also weaken the heart and cause it to not beat as well.  We will have them send the colon cancer screening test to your house. If the test comes back negative there is a 98% chance you do not have colon polyps or colon cancer. If the test comes back positive amines you do have colon polyps or colon cancer and need to have a colonoscopy.  Come back in about 6-8 weeks so that we can see how you're doing, if you have problems or questions before the next visit please feel free to call our office.  Atrial Fibrillation Atrial fibrillation is a condition that causes your heart to beat irregularly. It may also cause your heart to beat faster than normal. Atrial fibrillation can prevent your heart from pumping blood normally. It increases your risk of stroke and heart problems. HOME CARE  Take medications as told by your doctor.  Only take medications that your doctor says are safe. Some medications can make the condition worse or happen again.  If blood thinners were prescribed by your doctor, take them exactly as told. Too much can cause bleeding. Too little and you will not have the needed protection against stroke and other problems.  Perform blood tests at home if told by your doctor.  Perform blood tests exactly as told by  your doctor.  Do not drink alcohol.  Do not drink beverages with caffeine such as coffee, soda, and some teas.  Maintain a healthy weight.  Do not use diet pills unless your doctor says they are safe. They may make heart problems worse.  Follow diet instructions as told by your doctor.  Exercise regularly as told by your doctor.  Keep all follow-up appointments. GET HELP IF:  You notice a change in the speed, rhythm, or strength of your heartbeat.  You suddenly begin peeing (urinating) more often.  You get tired more easily when moving or exercising. GET HELP RIGHT AWAY IF:   You have chest or belly (abdominal) pain.  You feel sick to your stomach (nauseous).  You are short of breath.  You suddenly have swollen feet and ankles.  You feel dizzy.  You face, arms, or legs feel numb or weak.  There is a change in your vision or speech. MAKE SURE YOU:   Understand these instructions.  Will watch your condition.  Will get help right away if you are not doing well or get worse. Document Released: 04/07/2008 Document Revised: 11/13/2013 Document Reviewed: 08/09/2012 [facility] Patient Information 2015 [place], [place]. This information is not intended to replace advice given to you by your health care provider. Make sure you discuss any questions you have with your health care provider.

## 2014-06-03 DIAGNOSIS — G4700 Insomnia, unspecified: Secondary | ICD-10-CM | POA: Insufficient documentation

## 2014-06-03 DIAGNOSIS — I480 Paroxysmal atrial fibrillation: Secondary | ICD-10-CM | POA: Insufficient documentation

## 2014-06-03 DIAGNOSIS — F1010 Alcohol abuse, uncomplicated: Secondary | ICD-10-CM | POA: Insufficient documentation

## 2014-06-03 DIAGNOSIS — J449 Chronic obstructive pulmonary disease, unspecified: Secondary | ICD-10-CM | POA: Insufficient documentation

## 2014-06-03 NOTE — Assessment & Plan Note (Signed)
Per the patient she does take Ativan 1 mg 3 times during the evening after mealtime and before she goes to bed. Have asked her to decrease to taking 2 mg at bedtime for the next 2-3 weeks then to decrease to taking 1 mg at bedtime for the next month. We'll then try to stop. She is to increase her trazodone from 25 mg at bedtime to 50 mg at bedtime would strongly recommend bringing her off Ativan entirely given her concurrent alcohol abuse at this time. This does make her very high risk for memory problems, falls. These risks were explained to her and her husband during visit today.

## 2014-06-03 NOTE — Progress Notes (Signed)
   Subjective:    Patient ID: Sonya Franco, female    DOB: Sep 27, 1940, 73 y.o.   MRN: [id number]  HPI The patient is a 73 year old female who comes in today to establish care and follow-up on an ER visit. She was recently seen in the ER for alcohol intoxication, atrial fibrillation. She converted spontaneously to sinus rhythm during her stay in the emergency room and was able to be discharged. She had been free of alcohol for many years however recently in the last 3 months started drinking alcohol again. She started drinking more alcohol and states that things got a little out of hand. After she was seen in the emergency room she states that she's been drinking less alcohol however is unable to quantify how much chocolate she is currently drinking. Her husband is with her however does not help to provide any history. She also takes Ativan 3 times in the evening for a total of 3 mg Ativan daily. She otherwise has COPD and states that many medications give her breathing problems. This is why she started drinking again and she felt that Tylenol or Advil would hurt her breathing. She only uses pro-air at this time for her breathing. Spiriva is on her list however she states she does not know and does not take it.  Review of Systems  Constitutional: Negative for fever, activity change, appetite change, fatigue and unexpected weight change.  HENT: Negative.   Eyes: Negative.   Respiratory: Negative for cough, chest tightness, shortness of breath and wheezing.   Cardiovascular: Negative for chest pain, palpitations and leg swelling.  Gastrointestinal: Negative for abdominal pain, diarrhea, constipation and abdominal distention.  Musculoskeletal: Positive for arthralgias. Negative for myalgias, back pain and gait problem.  Skin: Negative.   Neurological: Negative for dizziness, light-headedness, numbness and headaches.  Psychiatric/Behavioral: Positive for sleep disturbance, dysphoric mood and  decreased concentration.      Objective:   Physical Exam  Constitutional: She appears well-developed and well-nourished.  HENT:  Head: Normocephalic and atraumatic.  Eyes: EOM are normal.  Neck: Normal range of motion.  Cardiovascular: Normal rate and regular rhythm.   Appears to be sinus rhythm  Pulmonary/Chest: Effort normal and breath sounds normal. No respiratory distress. She has no wheezes. She has no rales.  Abdominal: Soft. Bowel sounds are normal. She exhibits no distension. There is no tenderness. There is no rebound.  Neurological: She is alert.  Skin: Skin is warm and dry.   Filed Vitals:   06/01/14 1030  BP: 136/76  Pulse: 83  Temp: 97.9 F (36.6 C)  TempSrc: Oral  Resp: 12  Height: 5\' 1"  (1.549 m)  Weight: 173 lb 12.8 oz (78.835 kg)  SpO2: 94%      Assessment & Plan:

## 2014-06-03 NOTE — Assessment & Plan Note (Signed)
Worse recently, advised her that she is able to take Tylenol which will not affect her breathing. She has been self-medicating with alcohol.

## 2014-06-03 NOTE — Assessment & Plan Note (Signed)
Patient was found to be in atrial fibrillation when actively intoxicated. She is in sinus rhythm during today's visit. Have advised her to completely stop using alcohol. We'll continue to monitor. She is not good candidate for anticoagulation at this time given her alcohol use and high risk of fall with concurrent abuse of Ativan.

## 2014-06-03 NOTE — Assessment & Plan Note (Signed)
Patient was intoxicated last Monday prior to visit which caused her to go into atrial fibrillation. We did talk about the extensive risks of alcohol use and abuse. Do feel that she is currently still abusing alcohol for her pains in her shoulder. Spoke with her about the long-term risk of dilation of her heart and chronic atrial fibrillation which increases risk for heart attack and stroke. Advised her that she should stop drinking alcohol altogether.

## 2014-06-03 NOTE — Assessment & Plan Note (Signed)
She is not using Spiriva. She uses pro-air. The reason she has been self-medicating with alcohol as that she thinks that she cannot take any medications as they will hurt her breathing. No wheezing or rales on exam.

## 2014-06-04 ENCOUNTER — Telehealth: Payer: Self-pay | Admitting: Internal Medicine

## 2014-06-04 NOTE — Telephone Encounter (Signed)
Pt is having problems with a few of her medications, pt requesting to talk to assistant to discuss (see note from 11/20 regarding the meds she is mentioning) Pt also believes she may have sinus infection and informed MD at visit, now requesting meds for that problem. Pls advise.

## 2014-06-04 NOTE — Telephone Encounter (Signed)
Patient is not happy with the decreased dosages of her medications. I told her she would have to make an office visit to come in and discuss that with Dr. Dorise HissKollar. She also says that she has a sinus infection and would like an antibiotic. I told her we would have to schedule an appointment for that as well. Or, she can go to an urgent care. She needs to be evaluated before any antibiotics are prescribed.

## 2014-07-05 ENCOUNTER — Other Ambulatory Visit: Payer: Self-pay | Admitting: Geriatric Medicine

## 2014-07-05 MED ORDER — FLUTICASONE PROPIONATE 50 MCG/ACT NA SUSP: 2.0000 | Freq: Every day | NASAL | Status: AC

## 2014-07-18 ENCOUNTER — Other Ambulatory Visit: Payer: Self-pay | Admitting: Geriatric Medicine

## 2014-07-18 MED ORDER — HYDROCORTISONE 2.5 % RE CREA: 1.0000 "application " | TOPICAL_CREAM | Freq: Two times a day (BID) | RECTAL | Status: AC

## 2014-07-19 ENCOUNTER — Ambulatory Visit: Payer: Medicare Other | Admitting: Internal Medicine

## 2014-08-05 ENCOUNTER — Emergency Department (HOSPITAL_COMMUNITY)
Admission: EM | Admit: 2014-08-05 | Discharge: 2014-08-05 | Disposition: A | Discharge status: Home / Self Care | Payer: Medicare Other | Attending: Emergency Medicine | Admitting: Emergency Medicine

## 2014-08-05 ENCOUNTER — Emergency Department (HOSPITAL_COMMUNITY): Payer: Medicare Other

## 2014-08-05 ENCOUNTER — Encounter (HOSPITAL_COMMUNITY): Payer: Self-pay | Admitting: Emergency Medicine

## 2014-08-05 DIAGNOSIS — Z79899 Other long term (current) drug therapy: Secondary | ICD-10-CM | POA: Diagnosis not present

## 2014-08-05 DIAGNOSIS — R0789 Other chest pain: Secondary | ICD-10-CM | POA: Diagnosis not present

## 2014-08-05 DIAGNOSIS — M7989 Other specified soft tissue disorders: Secondary | ICD-10-CM | POA: Diagnosis not present

## 2014-08-05 DIAGNOSIS — Z7952 Long term (current) use of systemic steroids: Secondary | ICD-10-CM | POA: Diagnosis not present

## 2014-08-05 DIAGNOSIS — Z720 Tobacco use: Secondary | ICD-10-CM | POA: Diagnosis not present

## 2014-08-05 DIAGNOSIS — R05 Cough: Secondary | ICD-10-CM | POA: Diagnosis not present

## 2014-08-05 DIAGNOSIS — Z79890 Hormone replacement therapy (postmenopausal): Secondary | ICD-10-CM | POA: Diagnosis not present

## 2014-08-05 DIAGNOSIS — K219 Gastro-esophageal reflux disease without esophagitis: Secondary | ICD-10-CM | POA: Insufficient documentation

## 2014-08-05 DIAGNOSIS — R2243 Localized swelling, mass and lump, lower limb, bilateral: Secondary | ICD-10-CM | POA: Diagnosis not present

## 2014-08-05 DIAGNOSIS — R0602 Shortness of breath: Secondary | ICD-10-CM | POA: Insufficient documentation

## 2014-08-05 DIAGNOSIS — R079 Chest pain, unspecified: Secondary | ICD-10-CM | POA: Diagnosis not present

## 2014-08-05 LAB — BASIC METABOLIC PANEL
Anion gap: 12 (ref 5–15)
BUN: 7 mg/dL (ref 6–23)
CO2: 21 mmol/L (ref 19–32)
Calcium: 8.9 mg/dL (ref 8.4–10.5)
Chloride: 98 mmol/L (ref 96–112)
Creatinine, Ser: 0.7 mg/dL (ref 0.50–1.10)
GFR, EST NON AFRICAN AMERICAN: 83 mL/min — AB (ref >90)
GLUCOSE: 89 mg/dL (ref 70–99)
Potassium: 3.9 mmol/L (ref 3.5–5.1)
SODIUM: 131 mmol/L — AB (ref 135–145)

## 2014-08-05 LAB — I-STAT TROPONIN, ED: TROPONIN I, POC: 0.01 ng/mL (ref 0.00–0.08)

## 2014-08-05 LAB — CBC
HCT: 42 % (ref 36.0–46.0)
Hemoglobin: 13.7 g/dL (ref 12.0–15.0)
MCH: 30.6 pg (ref 26.0–34.0)
MCHC: 32.6 g/dL (ref 30.0–36.0)
MCV: 93.8 fL (ref 78.0–100.0)
PLATELETS: 268 10*3/uL (ref 150–400)
RBC: 4.48 MIL/uL (ref 3.87–5.11)
RDW: 14.8 % (ref 11.5–15.5)
WBC: 10.4 10*3/uL (ref 4.0–10.5)

## 2014-08-05 LAB — ETHANOL: ALCOHOL ETHYL (B): 25 mg/dL — AB (ref 0–9)

## 2014-08-05 LAB — BRAIN NATRIURETIC PEPTIDE: B Natriuretic Peptide: 139.1 pg/mL — ABNORMAL HIGH (ref 0.0–100.0)

## 2014-08-05 MED ORDER — LORAZEPAM 1 MG PO TABS: 1.0000 mg | ORAL_TABLET | Freq: Three times a day (TID) | ORAL | Status: DC | PRN

## 2014-08-05 MED ORDER — LORAZEPAM 2 MG/ML IJ SOLN
1.0000 mg | Freq: Once | INTRAMUSCULAR | Status: AC
  Administered 2014-08-05: 1 mg via INTRAVENOUS
  Filled 2014-08-05: qty 1

## 2014-08-05 NOTE — ED Notes (Signed)
Dr Harrison at bedside

## 2014-08-05 NOTE — ED Notes (Signed)
Awake. Verbally responsive. A/O x4. Resp even and unlabored. No audible adventitious breath sounds noted. ABC's intact.  

## 2014-08-05 NOTE — Discharge Instructions (Signed)
Shortness of Breath Shortness of breath means you have trouble breathing. It could also mean that you have a medical problem. You should get immediate medical care for shortness of breath. CAUSES   Not enough oxygen in the air such as with high altitudes or a smoke-filled room.  Certain lung diseases, infections, or problems.  Heart disease or conditions, such as angina or heart failure.  Low red blood cells (anemia).  Poor physical fitness, which can cause shortness of breath when you exercise.  Chest or back injuries or stiffness.  Being overweight.  Smoking.  Anxiety, which can make you feel like you are not getting enough air. DIAGNOSIS  Serious medical problems can often be found during your physical exam. Tests may also be done to determine why you are having shortness of breath. Tests may include:  Chest X-rays.  Lung function tests.  Blood tests.  An electrocardiogram (ECG).  An ambulatory electrocardiogram. An ambulatory ECG records your heartbeat patterns over a 24-hour period.  Exercise testing.  A transthoracic echocardiogram (TTE). During echocardiography, sound waves are used to evaluate how blood flows through your heart.  A transesophageal echocardiogram (TEE).  Imaging scans. Your health care provider may not be able to find a cause for your shortness of breath after your exam. In this case, it is important to have a follow-up exam with your health care provider as directed.  TREATMENT  Treatment for shortness of breath depends on the cause of your symptoms and can vary greatly. HOME CARE INSTRUCTIONS   Do not smoke. Smoking is a common cause of shortness of breath. If you smoke, ask for help to quit.  Avoid being around chemicals or things that may bother your breathing, such as paint fumes and dust.  Rest as needed. Slowly resume your usual activities.  If medicines were prescribed, take them as directed for the full length of time directed. This  includes oxygen and any inhaled medicines.  Keep all follow-up appointments as directed by your health care provider. SEEK MEDICAL CARE IF:   Your condition does not improve in the time expected.  You have a hard time doing your normal activities even with rest.  You have any new symptoms. SEEK IMMEDIATE MEDICAL CARE IF:   Your shortness of breath gets worse.  You feel light-headed, faint, or develop a cough not controlled with medicines.  You start coughing up blood.  You have pain with breathing.  You have chest pain or pain in your arms, shoulders, or abdomen.  You have a fever.  You are unable to walk up stairs or exercise the way you normally do. MAKE SURE YOU:  Understand these instructions.  Will watch your condition.  Will get help right away if you are not doing well or get worse. Document Released: 03/24/2001 Document Revised: 07/04/2013 Document Reviewed: 09/14/2011 [facility] Patient Information 2015 [place], [place]. This information is not intended to replace advice given to you by your health care provider. Make sure you discuss any questions you have with your health care provider. Edema Edema is an abnormal buildup of fluids in your bodytissues. Edema is somewhatdependent on gravity to pull the fluid to the lowest place in your body. That makes the condition more common in the legs and thighs (lower extremities). Painless swelling of the feet and ankles is common and becomes more likely as you get older. It is also common in looser tissues, like around your eyes.  When the affected area is squeezed, the fluid may  move out of that spot and leave a dent for a few moments. This dent is called pitting.  CAUSES  There are many possible causes of edema. Eating too much salt and being on your feet or sitting for a long time can cause edema in your legs and ankles. Hot weather may make edema worse. Common medical causes of edema include:  Heart failure.  Liver  disease.  Kidney disease.  Weak blood vessels in your legs.  Cancer.  An injury.  Pregnancy.  Some medications.  Obesity. SYMPTOMS  Edema is usually painless.Your skin may look swollen or shiny.  DIAGNOSIS  Your health care provider may be able to diagnose edema by asking about your medical history and doing a physical exam. You may need to have tests such as X-rays, an electrocardiogram, or blood tests to check for medical conditions that may cause edema.  TREATMENT  Edema treatment depends on the cause. If you have heart, liver, or kidney disease, you need the treatment appropriate for these conditions. General treatment may include:  Elevation of the affected body part above the level of your heart.  Compression of the affected body part. Pressure from elastic bandages or support stockings squeezes the tissues and forces fluid back into the blood vessels. This keeps fluid from entering the tissues.  Restriction of fluid and salt intake.  Use of a water pill (diuretic). These medications are appropriate only for some types of edema. They pull fluid out of your body and make you urinate more often. This gets rid of fluid and reduces swelling, but diuretics can have side effects. Only use diuretics as directed by your health care provider. HOME CARE INSTRUCTIONS   Keep the affected body part above the level of your heart when you are lying down.   Do not sit still or stand for prolonged periods.   Do not put anything directly under your knees when lying down.  Do not wear constricting clothing or garters on your upper legs.   Exercise your legs to work the fluid back into your blood vessels. This may help the swelling go down.   Wear elastic bandages or support stockings to reduce ankle swelling as directed by your health care provider.   Eat a low-salt diet to reduce fluid if your health care provider recommends it.   Only take medicines as directed by your  health care provider. SEEK MEDICAL CARE IF:   Your edema is not responding to treatment.  You have heart, liver, or kidney disease and notice symptoms of edema.  You have edema in your legs that does not improve after elevating them.   You have sudden and unexplained weight gain. SEEK IMMEDIATE MEDICAL CARE IF:   You develop shortness of breath or chest pain.   You cannot breathe when you lie down.  You develop pain, redness, or warmth in the swollen areas.   You have heart, liver, or kidney disease and suddenly get edema.  You have a fever and your symptoms suddenly get worse. MAKE SURE YOU:   Understand these instructions.  Will watch your condition.  Will get help right away if you are not doing well or get worse. Document Released: 06/29/2005 Document Revised: 11/13/2013 Document Reviewed: 04/21/2013 [facility] Patient Information 2015 [place], [place]. This information is not intended to replace advice given to you by your health care provider. Make sure you discuss any questions you have with your health care provider.

## 2014-08-05 NOTE — ED Provider Notes (Signed)
CSN: [id number]     Arrival date & time 08/05/14  1317 History   First MD Initiated Contact with Patient 08/05/14 1533     Chief Complaint  Patient presents with  . Shortness of Breath  . Leg Swelling     (Consider location/radiation/quality/duration/timing/severity/associated sxs/prior Treatment) HPI Comments: Patient presents emergency department with chief complaint of shortness of breath, lower extremity swelling. She also states that she has felt fatigued over the past several weeks. Symptoms have been progressively worsening. She also complains of a tightness in her chest. She reports having a history of A. fib, but there is no record of this. Patient states that she does not have a primary care provider. She does not take any anticoagulants. She denies any abdominal pain, nausea, vomiting, diarrhea, constipation. She has not taken anything to alleviate her symptoms. She is comfortable her husband, who states that additionally the patient has been drinking quite a bit. Patient reports having some tremors because the drinking.  The history is provided by the patient. No language interpreter was used.    Past Medical History  Diagnosis Date  . Postmenopausal HRT (hormone replacement therapy)   . Allergy   . GERD (gastroesophageal reflux disease)   . Arthritis   . Hemorrhoids    Past Surgical History  Procedure Laterality Date  . Cesarean section    . Cesarean section      3 times  . Tubal ligation    . Tonsillectomy    . Appendectomy     Family History  Problem Relation Age of Onset  . Multiple sclerosis Mother   . Thyroid disease Mother   . Other Mother     MS  . Stroke Father   . Hypertension Father   . Heart disease Father    History  Substance Use Topics  . Smoking status: Current Every Day Smoker -- 0.30 packs/day    Types: Cigarettes  . Smokeless tobacco: Never Used  . Alcohol Use: No   OB History    Gravida Para Term Preterm AB TAB SAB Ectopic Multiple  Living   4 4 3 1      3      Review of Systems  Constitutional: Negative for fever and chills.  Respiratory: Positive for chest tightness and shortness of breath.   Cardiovascular: Negative for chest pain.  Gastrointestinal: Negative for nausea, vomiting, diarrhea and constipation.  Genitourinary: Negative for dysuria.  All other systems reviewed and are negative.     Allergies  Diclofenac  Home Medications   Prior to Admission medications   Medication Sig Start Date End Date Taking? Authorizing Provider  cetirizine (ZYRTEC) 10 MG tablet Take 10 mg by mouth daily.    Yes Historical Provider, MD  fluticasone (FLONASE) 50 MCG/ACT nasal spray Place 2 sprays into both nostrils daily. 07/05/14  Yes Judie BonusElizabeth A Kollar, MD  hydrocortisone (PROCTOSOL HC) 2.5 % rectal cream Place 1 application rectally 2 (two) times daily. 07/18/14  Yes Judie BonusElizabeth A Kollar, MD  LORazepam (ATIVAN) 1 MG tablet Take 1 tablet (1 mg total) by mouth 2 (two) times daily as needed for sleep. 06/01/14  Yes Judie BonusElizabeth A Kollar, MD  omeprazole (PRILOSEC) 40 MG capsule TAKE ONE CAPSULE BY MOUTH EVERY DAY (*MYLAN BRAND ONLY*) 04/13/14  Yes Stacie GlazeJohn E Jenkins, MD  PROAIR HFA 108 (90 BASE) MCG/ACT inhaler INHALE 2 PUFFS INTO THE LUNGS 2 (TWO) TIMES DAILY.   Yes Stacie GlazeJohn E Jenkins, MD  mupirocin ointment (BACTROBAN) 2 % APPLY TO SORE  3 TIMES A DAY Patient not taking: Reported on 05/28/2014 11/15/13   Stacie Glaze, MD  tiotropium (SPIRIVA HANDIHALER) 18 MCG inhalation capsule Place 1 capsule (18 mcg total) into inhaler and inhale daily. Patient not taking: Reported on 05/28/2014 10/18/13   Stacie Glaze, MD  traZODone (DESYREL) 50 MG tablet Take 1 tablet (50 mg total) by mouth at bedtime. Patient not taking: Reported on 08/05/2014 06/01/14   Judie Bonus, MD   BP 145/78 mmHg  Pulse 83  Temp(Src) 98.1 F (36.7 C) (Oral)  Resp 20  SpO2 99% Physical Exam  Constitutional: She is oriented to person, place, and time. She appears  well-developed and well-nourished.  Slightly tremulous  HENT:  Head: Normocephalic and atraumatic.  Eyes: Conjunctivae and EOM are normal. Pupils are equal, round, and reactive to light.  Neck: Normal range of motion. Neck supple.  Cardiovascular: Normal rate and regular rhythm.  Exam reveals no gallop and no friction rub.   No murmur heard. Pulmonary/Chest: Effort normal and breath sounds normal. No respiratory distress. She has no wheezes. She has no rales. She exhibits no tenderness.  Abdominal: Soft. Bowel sounds are normal. She exhibits no distension and no mass. There is no tenderness. There is no rebound and no guarding.  Musculoskeletal: Normal range of motion. She exhibits edema. She exhibits no tenderness.  Bilateral lower extremity edema  Neurological: She is alert and oriented to person, place, and time.  Skin: Skin is warm and dry.  Psychiatric: She has a normal mood and affect. Her behavior is normal. Judgment and thought content normal.  Nursing note and vitals reviewed.   ED Course  Procedures (including critical care time) Results for orders placed or performed during the hospital encounter of 08/05/14  Basic metabolic panel    (if pt has PMH of COPD)  Result Value Ref Range   Sodium 131 (L) 135 - 145 mmol/L   Potassium 3.9 3.5 - 5.1 mmol/L   Chloride 98 96 - 112 mmol/L   CO2 21 19 - 32 mmol/L   Glucose, Bld 89 70 - 99 mg/dL   BUN 7 6 - 23 mg/dL   Creatinine, Ser 1.61 0.50 - 1.10 mg/dL   Calcium 8.9 8.4 - 09.6 mg/dL   GFR calc non Af Amer 83 (L) >90 mL/min   GFR calc Af Amer >90 >90 mL/min   Anion gap 12 5 - 15  CBC     (if pt has PMH of COPD)  Result Value Ref Range   WBC 10.4 4.0 - 10.5 K/uL   RBC 4.48 3.87 - 5.11 MIL/uL   Hemoglobin 13.7 12.0 - 15.0 g/dL   HCT 04.5 40.9 - 81.1 %   MCV 93.8 78.0 - 100.0 fL   MCH 30.6 26.0 - 34.0 pg   MCHC 32.6 30.0 - 36.0 g/dL   RDW 91.4 78.2 - 95.6 %   Platelets 268 150 - 400 K/uL  Ethanol  Result Value Ref Range    Alcohol, Ethyl (B) 25 (H) 0 - 9 mg/dL  Brain natriuretic peptide  Result Value Ref Range   B Natriuretic Peptide 139.1 (H) 0.0 - 100.0 pg/mL  I-Stat Troponin, ED (not at [facility])  Result Value Ref Range   Troponin i, poc 0.01 0.00 - 0.08 ng/mL   Comment 3           Dg Chest 2 View (if Patient Has Fever And/or Copd)  08/05/2014   CLINICAL DATA:  Cough and chest pain for  4 months  EXAM: CHEST  2 VIEW  COMPARISON:  05/28/2014  FINDINGS: Cardiac shadow is within normal limits. The lungs are well aerated with the exception of some mild scarring in the left base laterally. No focal infiltrate or sizable effusion is seen. Chronic compression deformities are noted within the thoracic spine.  IMPRESSION: No active cardiopulmonary disease.   Electronically Signed   By: Alcide Clever M.D.   On: 08/05/2014 14:32     Imaging Review Dg Chest 2 View (if Patient Has Fever And/or Copd)  08/05/2014   CLINICAL DATA:  Cough and chest pain for 4 months  EXAM: CHEST  2 VIEW  COMPARISON:  05/28/2014  FINDINGS: Cardiac shadow is within normal limits. The lungs are well aerated with the exception of some mild scarring in the left base laterally. No focal infiltrate or sizable effusion is seen. Chronic compression deformities are noted within the thoracic spine.  IMPRESSION: No active cardiopulmonary disease.   Electronically Signed   By: Alcide Clever M.D.   On: 08/05/2014 14:32     EKG Interpretation   Date/Time:  Sunday August 05 2014 15:59:07 EST Ventricular Rate:  77 PR Interval:  153 QRS Duration: 158 QT Interval:  499 QTC Calculation: 565 R Axis:   -45 Text Interpretation:  Sinus rhythm Nonspecific IVCD with LAD Otherwise no  significant change Confirmed by HARRISON  MD, FORREST ([id number]) on 08/05/2014  4:01:12 PM      MDM   Final diagnoses:  Leg swelling  SOB (shortness of breath)    Patient with SOB and lower extremity swelling.  No chest pain. No fever or cough.  Will check labs and imaging.  Labs  are largely reassuring.  BNP mildly elevated.  Patient seen by and discussed with Dr. Romeo Apple.  Patient denies any SOB or any symptoms except anxiety to Dr. Romeo Apple.  She asks for some ativan.  Feels better after a dose of ativan here.  Dr. Romeo Apple agrees that the patient can be discharged to home with PCP follow-up.  Patient understands and agrees.  She is stable and ready for discharge.    Roxy Horseman, PA-C 08/05/14 1852  Purvis Sheffield, MD 08/06/14 2249

## 2014-08-05 NOTE — ED Notes (Addendum)
Pt from home c/o bilateral lower extremity edema, sore throat, and shortness of breath and cough x few weeks.

## 2014-08-05 NOTE — ED Notes (Signed)
Pt husband requested that pt have something to eat and was advised that after pts test result and the EDP comes to talk to them, we will get her something at that time

## 2014-08-05 NOTE — ED Notes (Signed)
Pt has tremors, anxiety and tactile disturbance. Pt reports that she drank 6 beers this am. Pt is A&O and in NAD. Rob, PA notified

## 2014-08-07 ENCOUNTER — Ambulatory Visit (INDEPENDENT_AMBULATORY_CARE_PROVIDER_SITE_OTHER): Payer: Medicare Other | Admitting: Internal Medicine

## 2014-08-07 ENCOUNTER — Encounter: Payer: Self-pay | Admitting: Internal Medicine

## 2014-08-07 VITALS — BP 110/66 | HR 52 | Temp 98.4°F | Resp 18 | Wt 177.1 lb

## 2014-08-07 DIAGNOSIS — I872 Venous insufficiency (chronic) (peripheral): Secondary | ICD-10-CM

## 2014-08-07 DIAGNOSIS — R4589 Other symptoms and signs involving emotional state: Secondary | ICD-10-CM

## 2014-08-07 DIAGNOSIS — F1010 Alcohol abuse, uncomplicated: Secondary | ICD-10-CM

## 2014-08-07 DIAGNOSIS — G4700 Insomnia, unspecified: Secondary | ICD-10-CM

## 2014-08-07 DIAGNOSIS — F603 Borderline personality disorder: Secondary | ICD-10-CM | POA: Diagnosis not present

## 2014-08-07 MED ORDER — DIAZEPAM 5 MG PO TABS: 5.0000 mg | ORAL_TABLET | Freq: Two times a day (BID) | ORAL | Status: AC | PRN

## 2014-08-07 MED ORDER — PAROXETINE HCL 20 MG PO TABS: 20.0000 mg | ORAL_TABLET | Freq: Every day | ORAL | Status: AC

## 2014-08-07 NOTE — Patient Instructions (Addendum)
We want you to STOP drinking any alcohol. For the next 1-2 days you can use 1 diazepam every 12 hours if you are having shakes or heart beating.   We will start a medicine called paroxetine that helps to balance the hormones in the brain. It will help with some low moods or racing thoughts that can keep you awake at night. Take 1 pill a day.  Come back in about 3-4 weeks so we can see how you are doing.   We will have you prop the feet up when you are sitting at home. We will also give you a prescription for the stockings which will help to keep the fluid out of your legs.   We recommend tylenol for pain if you have pain in your joints.   Alcohol Withdrawal Anytime drug use is interfering with normal living activities it has become abuse. This includes problems with family and friends. Psychological dependence has developed when your mind tells you that the drug is needed. This is usually followed by physical dependence when a continuing increase of drugs are required to get the same feeling or "high." This is known as addiction or chemical dependency. A person's risk is much higher if there is a history of chemical dependency in the family. Mild Withdrawal Following Stopping Alcohol, When Addiction or Chemical Dependency Has Developed When a person has developed tolerance to alcohol, any sudden stopping of alcohol can cause uncomfortable physical symptoms. Most of the time these are mild and consist of tremors in the hands and increases in heart rate, breathing, and temperature. Sometimes these symptoms are associated with anxiety, panic attacks, and bad dreams. There may also be stomach upset. Normal sleep patterns are often interrupted with periods of inability to sleep (insomnia). This may last for 6 months. Because of this discomfort, many people choose to continue drinking to get rid of this discomfort and to try to feel normal. Severe Withdrawal with Decreased or No Alcohol Intake, When  Addiction or Chemical Dependency Has Developed About five percent of alcoholics will develop signs of severe withdrawal when they stop using alcohol. One sign of this is development of generalized seizures (convulsions). Other signs of this are severe agitation and confusion. This may be associated with believing in things which are not real or seeing things which are not really there (delusions and hallucinations). Vitamin deficiencies are usually present if alcohol intake has been long-term. Treatment for this most often requires hospitalization and close observation. Addiction can only be helped by stopping use of all chemicals. This is hard but may save your life. With continual alcohol use, possible outcomes are usually loss of self respect and esteem, violence, and death. Addiction cannot be cured but it can be stopped. This often requires outside help and the care of professionals. Treatment centers are listed in the yellow pages under Cocaine, Narcotics, and Alcoholics Anonymous. Most hospitals and clinics can refer you to a specialized care center. It is not necessary for you to go through the uncomfortable symptoms of withdrawal. Your caregiver can provide you with medicines that will help you through this difficult period. Try to avoid situations, friends, or drugs that made it possible for you to keep using alcohol in the past. Learn how to say no. It takes a long period of time to overcome addictions to all drugs, including alcohol. There may be many times when you feel as though you want a drink. After getting rid of the physical addiction and withdrawal, you  will have a lessening of the craving which tells you that you need alcohol to feel normal. Call your caregiver if more support is needed. Learn who to talk to in your family and among your friends so that during these periods you can receive outside help. Alcoholics Anonymous (AA) has helped many people over the years. To get further help,  contact AA or call your caregiver, counselor, or clergyperson. Al-Anon and Alateen are support groups for friends and family members of an alcoholic. The people who love and care for an alcoholic often need help, too. For information about these organizations, check your phone directory or call a local alcoholism treatment center.  SEEK IMMEDIATE MEDICAL CARE IF:   You have a seizure.  You have a fever.  You experience uncontrolled vomiting or you vomit up blood. This may be bright red or look like black coffee grounds.  You have blood in the stool. This may be bright red or appear as a black, tarry, bad-smelling stool.  You become lightheaded or faint. Do not drive if you feel this way. Have someone else drive you or call 161 for help.  You become more agitated or confused.  You develop uncontrolled anxiety.  You begin to see things that are not really there (hallucinate). Your caregiver has determined that you completely understand your medical condition, and that your mental state is back to normal. You understand that you have been treated for alcohol withdrawal, have agreed not to drink any alcohol for a minimum of 1 day, will not operate a car or other machinery for 24 hours, and have had an opportunity to ask any questions about your condition. Document Released: 04/08/2005 Document Revised: 09/21/2011 Document Reviewed: 02/15/2008 [facility] Patient Information 2015 [place], [place]. This information is not intended to replace advice given to you by your health care provider. Make sure you discuss any questions you have with your health care provider.

## 2014-08-07 NOTE — Progress Notes (Signed)
Pre visit review using our clinic review tool, if applicable. No additional management support is needed unless otherwise documented below in the visit note. 

## 2014-08-09 DIAGNOSIS — R4589 Other symptoms and signs involving emotional state: Secondary | ICD-10-CM | POA: Insufficient documentation

## 2014-08-09 DIAGNOSIS — I872 Venous insufficiency (chronic) (peripheral): Secondary | ICD-10-CM | POA: Insufficient documentation

## 2014-08-09 NOTE — Assessment & Plan Note (Signed)
Patient previously taking 3 mg lorazepam for sleep and unclear what will be effective given concurrent alcohol usage. Will see her back shortly after stopping alcohol to see how she is doing.

## 2014-08-09 NOTE — Assessment & Plan Note (Signed)
Both legs swollen equally and keeps them hanging down most of the day. Recent LFTs not abnormal and no signs of heart failure. Will trial compression stockings. Will re-evaluate at next visit.

## 2014-08-09 NOTE — Progress Notes (Signed)
   Subjective:    Patient ID: Sonya Franco, female    DOB: 1941-03-24, 74 y.o.   MRN: [id number]  HPI The patient is a 74 YO female who is coming in to follow up on ankle swelling and ER visit. She is still drinking more than usual. She has been having shakes which she treats with vodka. Her daughter and husband are with her today. Her husband is having a surgery tomorrow for mass in his spleen. The daughter and husband have already taken to her about her alcohol usage and her lorazepam usage. Last visit we talked about reducing her lorazepam slowly and she has not done this but she has run out and was given some at the ER but not many. She is willing to stop alcohol and affirms this during our meeting.   Review of Systems  Constitutional: Negative for fever, activity change, appetite change, fatigue and unexpected weight change.  HENT: Negative.   Eyes: Negative.   Respiratory: Negative for cough, chest tightness, shortness of breath and wheezing.   Cardiovascular: Negative for chest pain, palpitations and leg swelling.  Gastrointestinal: Negative for abdominal pain, diarrhea, constipation and abdominal distention.  Musculoskeletal: Positive for arthralgias. Negative for myalgias, back pain and gait problem.  Skin: Negative.   Neurological: Negative for dizziness, light-headedness, numbness and headaches.       Shakiness  Psychiatric/Behavioral: Positive for sleep disturbance, dysphoric mood and decreased concentration. Negative for confusion. The patient is nervous/anxious.       Objective:   Physical Exam  Constitutional: She is oriented to person, place, and time. She appears well-developed and well-nourished.  HENT:  Head: Normocephalic and atraumatic.  Eyes: EOM are normal.  Neck: Normal range of motion.  Cardiovascular: Normal rate and regular rhythm.   Pulmonary/Chest: Effort normal and breath sounds normal. No respiratory distress. She has no wheezes. She has no rales.    Abdominal: Soft. Bowel sounds are normal. She exhibits no distension. There is no tenderness. There is no rebound.  Neurological: She is alert and oriented to person, place, and time.  Skin: Skin is warm and dry.   Filed Vitals:   08/07/14 1343  BP: 110/66  Pulse: 52  Temp: 98.4 F (36.9 C)  TempSrc: Oral  Resp: 18  Weight: 177 lb 1.9 oz (80.341 kg)  SpO2: 97%      Assessment & Plan:

## 2014-08-09 NOTE — Assessment & Plan Note (Signed)
She is willing to stop and now may be an ideal time as her daughter is here with her. I talked to them about rehab but given that her husband will be having surgery soon they want her home for him. I did discuss that this can be very difficult and she may need help to stop. Talked with them about using diazepam 5 mg PO BID as needed for withdrawals and talked to them about the timeframe for withdrawal symptoms. She does not currently drive and the whole family will have to work to not supply with alcohol.

## 2014-08-09 NOTE — Assessment & Plan Note (Signed)
Previously patient had been using large amounts of benzos. Will start paxil on her to help with some of the mood and depression, talked to her and family about the fact that this medication is not maximally effective until 3-4 weeks after starting.

## 2014-08-10 ENCOUNTER — Ambulatory Visit: Payer: Medicare Other | Admitting: Internal Medicine

## 2014-08-24 ENCOUNTER — Other Ambulatory Visit: Payer: Self-pay | Admitting: Geriatric Medicine

## 2014-08-24 ENCOUNTER — Telehealth: Payer: Self-pay | Admitting: Internal Medicine

## 2014-08-24 NOTE — Telephone Encounter (Signed)
Per Dr. Dorise HissKollar this will not be refilled. It was to be used for alcohol withdrawal symptoms, not for sleep.

## 2014-08-24 NOTE — Telephone Encounter (Signed)
Pt called in and requested refill on her   diazepam (VALIUM) 5 MG tablet [[id number]][[id number]]       She said that she is not sleeping at night.   She is taking on every 12 hours she said.

## 2014-08-24 NOTE — Telephone Encounter (Signed)
Patient aware that diazepam will not be refilled.

## 2014-08-25 ENCOUNTER — Other Ambulatory Visit: Payer: Self-pay | Admitting: Internal Medicine

## 2015-01-07 ENCOUNTER — Other Ambulatory Visit: Payer: Self-pay

## 2015-03-24 ENCOUNTER — Telehealth: Payer: Self-pay | Admitting: Physician Assistant

## 2015-03-24 ENCOUNTER — Telehealth: Payer: Self-pay | Admitting: Internal Medicine

## 2015-03-24 NOTE — Telephone Encounter (Signed)
Call from Lelon Huh at [facility] Call from [place] with EMS Called to home and found her dead. Pronounced at 12:07PM Husband left her okay earlier in the day  Reviewed her history of alcoholism and depression No visit with Dr Dorise Hiss for ~8 months Unable to reach nathan now---asked Synetta Fail to let him know that this should be reviewed with the medical examiner If the medical examiner declines the case, Dr Dorise Hiss will do the death certificate.

## 2015-03-24 NOTE — Telephone Encounter (Signed)
Entered in error

## 2015-04-13 DIAGNOSIS — 419620001 Death: Secondary | SNOMED CT | POA: Diagnosis not present

## 2015-04-13 DEATH — deceased
